# Patient Record
Sex: Female | Born: 1990 | Race: Black or African American | Hispanic: No | Marital: Married | Smoking: Current every day smoker
Health system: Southern US, Community
[De-identification: ages and names within clinical notes are randomized; demographics above are authoritative.]

## PROBLEM LIST (undated history)

## (undated) ENCOUNTER — Emergency Department: Admission: EM | Payer: Self-pay | Source: Home / Self Care

## (undated) DIAGNOSIS — J45909 Unspecified asthma, uncomplicated: Secondary | ICD-10-CM

---
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
## 2012-01-25 ENCOUNTER — Emergency Department: Payer: Self-pay | Admitting: Emergency Medicine

## 2012-01-25 LAB — COMPREHENSIVE METABOLIC PANEL
Alkaline Phosphatase: 125 U/L (ref 82–169)
Bilirubin,Total: 0.3 mg/dL (ref 0.2–1.0)
Chloride: 108 mmol/L — ABNORMAL HIGH (ref 98–107)
Co2: 23 mmol/L (ref 21–32)
Creatinine: 0.75 mg/dL (ref 0.60–1.30)
Glucose: 89 mg/dL (ref 65–99)
Osmolality: 277 (ref 275–301)
Potassium: 3.9 mmol/L (ref 3.5–5.1)
SGOT(AST): 15 U/L (ref 0–26)
SGPT (ALT): 21 U/L (ref 12–78)

## 2012-01-25 LAB — CBC
HGB: 13.3 g/dL (ref 12.0–16.0)
MCH: 25.9 pg — ABNORMAL LOW (ref 26.0–34.0)
MCHC: 32.7 g/dL (ref 32.0–36.0)
RBC: 5.14 10*6/uL (ref 3.80–5.20)
RDW: 15 % — ABNORMAL HIGH (ref 11.5–14.5)
WBC: 8.1 10*3/uL (ref 3.6–11.0)

## 2012-01-25 LAB — URINALYSIS, COMPLETE
Bilirubin,UR: NEGATIVE
Glucose,UR: NEGATIVE mg/dL (ref 0–75)
Leukocyte Esterase: NEGATIVE
Nitrite: NEGATIVE
Ph: 6 (ref 4.5–8.0)
Protein: NEGATIVE
RBC,UR: <1 /HPF (ref 0–5)
Squamous Epithelial: 11
WBC UR: 2 /HPF (ref 0–5)

## 2012-02-24 ENCOUNTER — Emergency Department: Payer: Self-pay | Admitting: Emergency Medicine

## 2012-02-24 LAB — URINALYSIS, COMPLETE
Ketone: NEGATIVE
Ph: 5 (ref 4.5–8.0)
RBC,UR: <1 /HPF (ref 0–5)
Specific Gravity: 1.021 (ref 1.003–1.030)
WBC UR: 1 /HPF (ref 0–5)

## 2012-09-29 ENCOUNTER — Emergency Department: Payer: Self-pay | Admitting: Emergency Medicine

## 2013-08-25 ENCOUNTER — Emergency Department: Payer: Self-pay | Admitting: Emergency Medicine

## 2013-09-19 ENCOUNTER — Emergency Department: Payer: Self-pay | Admitting: Student

## 2013-11-25 ENCOUNTER — Emergency Department: Payer: Self-pay | Admitting: Emergency Medicine

## 2014-01-30 ENCOUNTER — Emergency Department: Payer: Self-pay | Admitting: Emergency Medicine

## 2014-04-12 ENCOUNTER — Emergency Department
Admit: 2014-04-12 | Disposition: A | Discharge status: Home / Self Care | Payer: Self-pay | Admitting: Emergency Medicine

## 2014-04-12 LAB — COMPREHENSIVE METABOLIC PANEL
ALT: 15 U/L
ANION GAP: 7 (ref 7–16)
AST: 23 U/L
Albumin: 3.6 g/dL
Alkaline Phosphatase: 77 U/L
BILIRUBIN TOTAL: 0.6 mg/dL
BUN: 11 mg/dL
CALCIUM: 8.7 mg/dL — AB
CO2: 25 mmol/L
Chloride: 106 mmol/L
Creatinine: 0.85 mg/dL
Glucose: 100 mg/dL — ABNORMAL HIGH
Potassium: 3.3 mmol/L — ABNORMAL LOW
Sodium: 138 mmol/L
Total Protein: 7.4 g/dL

## 2014-04-12 LAB — CBC
HCT: 40 % (ref 35.0–47.0)
HGB: 12.9 g/dL (ref 12.0–16.0)
MCH: 26.2 pg (ref 26.0–34.0)
MCHC: 32.4 g/dL (ref 32.0–36.0)
MCV: 81 fL (ref 80–100)
PLATELETS: 250 10*3/uL (ref 150–440)
RBC: 4.94 10*6/uL (ref 3.80–5.20)
RDW: 14.3 % (ref 11.5–14.5)
WBC: 9.7 10*3/uL (ref 3.6–11.0)

## 2014-04-12 LAB — URINALYSIS, COMPLETE
Bacteria: NEGATIVE
Bilirubin,UR: NEGATIVE
GLUCOSE, UR: NEGATIVE mg/dL (ref 0–75)
Ketone: NEGATIVE
LEUKOCYTE ESTERASE: NEGATIVE
NITRITE: NEGATIVE
Ph: 6 (ref 4.5–8.0)
Protein: NEGATIVE
Specific Gravity: 1.026 (ref 1.003–1.030)

## 2014-04-12 LAB — LIPASE, BLOOD: LIPASE: 47 U/L

## 2014-06-07 ENCOUNTER — Encounter (HOSPITAL_COMMUNITY): Payer: Self-pay | Admitting: Emergency Medicine

## 2014-06-07 ENCOUNTER — Emergency Department (HOSPITAL_COMMUNITY): Payer: Self-pay

## 2014-06-07 ENCOUNTER — Other Ambulatory Visit: Payer: Self-pay

## 2014-06-07 ENCOUNTER — Emergency Department (HOSPITAL_COMMUNITY)
Admission: EM | Admit: 2014-06-07 | Discharge: 2014-06-07 | Disposition: A | Discharge status: Home / Self Care | Payer: Self-pay | Attending: Emergency Medicine | Admitting: Emergency Medicine

## 2014-06-07 DIAGNOSIS — R55 Syncope and collapse: Secondary | ICD-10-CM | POA: Insufficient documentation

## 2014-06-07 DIAGNOSIS — S0990XA Unspecified injury of head, initial encounter: Secondary | ICD-10-CM | POA: Insufficient documentation

## 2014-06-07 DIAGNOSIS — S060X1A Concussion with loss of consciousness of 30 minutes or less, initial encounter: Secondary | ICD-10-CM | POA: Insufficient documentation

## 2014-06-07 DIAGNOSIS — S0011XA Contusion of right eyelid and periocular area, initial encounter: Secondary | ICD-10-CM | POA: Insufficient documentation

## 2014-06-07 DIAGNOSIS — H9313 Tinnitus, bilateral: Secondary | ICD-10-CM | POA: Insufficient documentation

## 2014-06-07 DIAGNOSIS — R52 Pain, unspecified: Secondary | ICD-10-CM

## 2014-06-07 DIAGNOSIS — S0993XA Unspecified injury of face, initial encounter: Secondary | ICD-10-CM | POA: Insufficient documentation

## 2014-06-07 DIAGNOSIS — Y939 Activity, unspecified: Secondary | ICD-10-CM | POA: Insufficient documentation

## 2014-06-07 DIAGNOSIS — S0991XA Unspecified injury of ear, initial encounter: Secondary | ICD-10-CM | POA: Insufficient documentation

## 2014-06-07 DIAGNOSIS — Z720 Tobacco use: Secondary | ICD-10-CM | POA: Insufficient documentation

## 2014-06-07 DIAGNOSIS — S199XXA Unspecified injury of neck, initial encounter: Secondary | ICD-10-CM | POA: Insufficient documentation

## 2014-06-07 DIAGNOSIS — J45909 Unspecified asthma, uncomplicated: Secondary | ICD-10-CM | POA: Insufficient documentation

## 2014-06-07 DIAGNOSIS — Y999 Unspecified external cause status: Secondary | ICD-10-CM | POA: Insufficient documentation

## 2014-06-07 DIAGNOSIS — R42 Dizziness and giddiness: Secondary | ICD-10-CM | POA: Insufficient documentation

## 2014-06-07 DIAGNOSIS — S069X9A Unspecified intracranial injury with loss of consciousness of unspecified duration, initial encounter: Secondary | ICD-10-CM

## 2014-06-07 DIAGNOSIS — Y929 Unspecified place or not applicable: Secondary | ICD-10-CM | POA: Insufficient documentation

## 2014-06-07 DIAGNOSIS — W2111XA Struck by baseball bat, initial encounter: Secondary | ICD-10-CM | POA: Insufficient documentation

## 2014-06-07 DIAGNOSIS — H539 Unspecified visual disturbance: Secondary | ICD-10-CM | POA: Insufficient documentation

## 2014-06-07 HISTORY — DX: Unspecified asthma, uncomplicated: J45.909

## 2014-06-07 MED ORDER — HYDROCODONE-ACETAMINOPHEN 5-325 MG PO TABS
1.0000 | ORAL_TABLET | Freq: Once | ORAL | Status: AC
  Administered 2014-06-07: 1 via ORAL
  Filled 2014-06-07: qty 1

## 2014-06-07 MED ORDER — IBUPROFEN 800 MG PO TABS: 800.0000 mg | ORAL_TABLET | Freq: Three times a day (TID) | ORAL | Status: DC

## 2014-06-07 MED ORDER — HYDROCODONE-ACETAMINOPHEN 5-325 MG PO TABS: 1.0000 | ORAL_TABLET | Freq: Four times a day (QID) | ORAL | Status: DC | PRN
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
## 2014-06-07 NOTE — Discharge Instructions (Signed)
Concussion Direct trauma to the head often causes a condition known as a concussion. This injury can temporarily interfere with brain function and may cause you to pass out (lose consciousness). The consequences of a concussion are usually short-term, but repetitive concussions can be very dangerous. If you have multiple concussions, you will have a greater risk of long-term effects, such as slurred speech, slow movements, impaired thinking, or tremors. The severity of a concussion is based on the length and severity of the interference with brain activity. SYMPTOMS  Symptoms of a concussion vary depending on the severity of the injury. Very mild concussions may even occur without any noticeable symptoms. Swelling in the area of the injury is not related to the seriousness of the injury.   Mild concussion:  Temporary loss of consciousness may or may not occur.  Memory loss (amnesia) for a short time.  Emotional instability.  Confusion.  Severe concussion:  Usually prolonged loss of consciousness.  Confusion  One pupil (the black part in the middle of the eye) is larger than the other.  Changes in vision (including blurring).  Changes in breathing.  Disturbed balance (equilibrium).  Headaches.  Confusion.  Nausea or vomiting.  Slower reaction time than normal.  Difficulty learning and remembering things you have heard. CAUSES  A concussion is the result of trauma to the head. When the head is subjected to such an injury, the brain strikes against the inner wall of the skull. This impact is what causes the damage to the brain. The force of injury is related to severity of injury. The most severe concussions are associated with incidents that involve large impact forces such as motor vehicle accidents. Wearing a helmet will reduce the severity of trauma to the head, but concussions may still occur if you are wearing a helmet. RISK INCREASES WITH:  Contact sports (football,  hockey, soccer, rugby, basketball or lacrosse).  Fighting sports (martial arts or boxing).  Riding bicycles, motorcycles, or horses (when you ride without a helmet). PREVENTION  Wear proper protective headgear and ensure correct fit.  Wear seat belts when driving and riding in a car.  Do not drink or use mind-altering drugs and drive. PROGNOSIS  Concussions are typically curable if they are recognized and treated early. If a severe concussion or multiple concussions go untreated, then the complications may be life-threatening or cause permanent disability and brain damage. RELATED COMPLICATIONS   Permanent brain damage (slurred speech, slow movement, impaired thinking, or tremors).  Bleeding under the skull (subdural hemorrhage or hematoma, epidural hematoma).  Bleeding into the brain.  Prolonged healing time if usual activities are resumed too soon.  Infection if skin over the concussion site is broken.  Increased risk of future concussions (less trauma is required for a second concussion than the first). TREATMENT  Treatment initially requires immediate evaluation to determine the severity of the concussion. Occasionally, a hospital stay may be required for observation and treatment.  Avoid exertion. Bed rest for the first 24-48 hours is recommended.  Return to play is a controversial subject due to the increased risk for future injury as well as permanent disability and should be discussed at length with your treating caregiver. Many factors such as the severity of the concussion and whether this is the first, second, or third concussion play a role in timing a patient's return to sports.  MEDICATION  Do not give any medicine, including non-prescription acetaminophen or aspirin, until the diagnosis is certain. These medicines may mask developing  symptoms.  SEEK IMMEDIATE MEDICAL CARE IF:   Symptoms get worse or do not improve in 24 hours.  Any of the following symptoms  occur:  Vomiting.  The inability to move arms and legs equally well on both sides.  Fever.  Neck stiffness.  Pupils of unequal size, shape, or reactivity.  Convulsions.  Noticeable restlessness.  Severe headache that persists for longer than 4 hours after injury.  Confusion, disorientation, or mental status changes. Document Released: 12/21/2004 Document Revised: 10/11/2012 Document Reviewed: 04/04/2008 [facility] Patient Information 2015 [place], [place]. This information is not intended to replace advice given to you by your health care provider. Make sure you discuss any questions you have with your health care provider.  Head Injury You have a head injury. Headaches and throwing up (vomiting) are common after a head injury. It should be easy to wake up from sleeping. Sometimes you must stay in the hospital. Most problems happen within the first 24 hours. Side effects may occur up to 7-10 days after the injury.  WHAT ARE THE TYPES OF HEAD INJURIES? Head injuries can be as minor as a bump. Some head injuries can be more severe. More severe head injuries include:  A jarring injury to the brain (concussion).  A bruise of the brain (contusion). This mean there is bleeding in the brain that can cause swelling.  A cracked skull (skull fracture).  Bleeding in the brain that collects, clots, and forms a bump (hematoma). WHEN SHOULD I GET HELP RIGHT AWAY?   You are confused or sleepy.  You cannot be woken up.  You feel sick to your stomach (nauseous) or keep throwing up (vomiting).  Your dizziness or unsteadiness is getting worse.  You have very bad, lasting headaches that are not helped by medicine. Take medicines only as told by your doctor.  You cannot use your arms or legs like normal.  You cannot walk.  You notice changes in the black spots in the center of the colored part of your eye (pupil).  You have clear or bloody fluid coming from your nose or ears.  You have  trouble seeing. During the next 24 hours after the injury, you must stay with someone who can watch you. This person should get help right away (call 911 in the [place] if you start to shake and are not able to control it (have seizures), you pass out, or you are unable to wake up. HOW CAN I PREVENT A HEAD INJURY IN THE FUTURE?  Wear seat belts.  Wear a helmet while bike riding and playing sports like football.  Stay away from dangerous activities around the house. WHEN CAN I RETURN TO NORMAL ACTIVITIES AND ATHLETICS? See your doctor before doing these activities. You should not do normal activities or play contact sports until 1 week after the following symptoms have stopped:  Headache that does not go away.  Dizziness.  Poor attention.  Confusion.  Memory problems.  Sickness to your stomach or throwing up.  Tiredness.  Fussiness.  Bothered by bright lights or loud noises.  Anxiousness or depression.  Restless sleep. MAKE SURE YOU:   Understand these instructions.  Will watch your condition.  Will get help right away if you are not doing well or get worse. Document Released: 12/04/2007 Document Revised: 05/07/2013 Document Reviewed: 08/28/2012 [facility] Patient Information 2015 [place], [place]. This information is not intended to replace advice given to you by your health care provider. Make sure you discuss any questions you have  with your health care provider. ° °
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
## 2014-06-18 NOTE — ED Provider Notes (Signed)
Medical screening examination/treatment/procedure(s) were performed by non-physician practitioner and as supervising physician I was immediately available for consultation/collaboration.  Medical screening examination/treatment/procedure(s) were performed by non-physician practitioner and as supervising physician I was immediately available for consultation/collaboration.   EKG Interpretation   Date/Time:  Friday June 07 2014 14:50:00 EDT Ventricular Rate:  97 PR Interval:  130 QRS Duration: 74 QT Interval:  346 QTC Calculation: 439 R Axis:   106 Text Interpretation:  Suspect arm lead reversal, interpretation  assumes no reversal Sinus rhythm with marked sinus arrhythmia Possible  Right ventricular hypertrophy Nonspecific T wave abnormality Abnormal ECG  ED PHYSICIAN INTERPRETATION AVAILABLE IN [facility] Confirmed by  TEST, Record ([id number]) on 06/11/2014 7:53:00 AM        Doug Sou, MD 06/18/14 1547

## 2014-09-27 ENCOUNTER — Encounter: Payer: Self-pay | Admitting: Emergency Medicine

## 2014-09-27 ENCOUNTER — Emergency Department
Admission: EM | Admit: 2014-09-27 | Discharge: 2014-09-27 | Disposition: A | Discharge status: Home / Self Care | Payer: Self-pay | Attending: Emergency Medicine | Admitting: Emergency Medicine

## 2014-09-27 DIAGNOSIS — R064 Hyperventilation: Secondary | ICD-10-CM | POA: Insufficient documentation

## 2014-09-27 DIAGNOSIS — Z720 Tobacco use: Secondary | ICD-10-CM | POA: Insufficient documentation

## 2014-09-27 DIAGNOSIS — R1012 Left upper quadrant pain: Secondary | ICD-10-CM | POA: Insufficient documentation

## 2014-09-27 DIAGNOSIS — Z3202 Encounter for pregnancy test, result negative: Secondary | ICD-10-CM | POA: Insufficient documentation

## 2014-09-27 DIAGNOSIS — R079 Chest pain, unspecified: Secondary | ICD-10-CM | POA: Insufficient documentation

## 2014-09-27 DIAGNOSIS — F419 Anxiety disorder, unspecified: Secondary | ICD-10-CM | POA: Insufficient documentation

## 2014-09-27 DIAGNOSIS — Z791 Long term (current) use of non-steroidal anti-inflammatories (NSAID): Secondary | ICD-10-CM | POA: Insufficient documentation

## 2014-09-27 LAB — COMPREHENSIVE METABOLIC PANEL
ALBUMIN: 4 g/dL (ref 3.5–5.0)
ALK PHOS: 72 U/L (ref 38–126)
ALT: 16 U/L (ref 14–54)
AST: 23 U/L (ref 15–41)
Anion gap: 6 (ref 5–15)
BUN: 11 mg/dL (ref 6–20)
CHLORIDE: 108 mmol/L (ref 101–111)
CO2: 24 mmol/L (ref 22–32)
CREATININE: 0.88 mg/dL (ref 0.44–1.00)
Calcium: 8.9 mg/dL (ref 8.9–10.3)
GFR calc Af Amer: >60 mL/min (ref >60)
GFR calc non Af Amer: >60 mL/min (ref >60)
GLUCOSE: 92 mg/dL (ref 65–99)
Potassium: 3.6 mmol/L (ref 3.5–5.1)
SODIUM: 138 mmol/L (ref 135–145)
Total Bilirubin: 0.6 mg/dL (ref 0.3–1.2)
Total Protein: 7.3 g/dL (ref 6.5–8.1)

## 2014-09-27 LAB — URINALYSIS COMPLETE WITH MICROSCOPIC (ARMC ONLY)
Bilirubin Urine: NEGATIVE
Glucose, UA: NEGATIVE mg/dL
Hgb urine dipstick: NEGATIVE
Nitrite: NEGATIVE
PROTEIN: NEGATIVE mg/dL
Specific Gravity, Urine: 1.027 (ref 1.005–1.030)
pH: 7 (ref 5.0–8.0)

## 2014-09-27 LAB — CBC
HCT: 37.6 % (ref 35.0–47.0)
HEMOGLOBIN: 12 g/dL (ref 12.0–16.0)
MCH: 26 pg (ref 26.0–34.0)
MCHC: 31.9 g/dL — ABNORMAL LOW (ref 32.0–36.0)
MCV: 81.3 fL (ref 80.0–100.0)
Platelets: 269 10*3/uL (ref 150–440)
RBC: 4.62 MIL/uL (ref 3.80–5.20)
RDW: 13.9 % (ref 11.5–14.5)
WBC: 10.4 10*3/uL (ref 3.6–11.0)

## 2014-09-27 LAB — PREGNANCY, URINE: Preg Test, Ur: NEGATIVE

## 2014-09-27 LAB — LIPASE, BLOOD: Lipase: 21 U/L — ABNORMAL LOW (ref 22–51)

## 2014-09-27 MED ORDER — LORAZEPAM 2 MG/ML IJ SOLN
1.0000 mg | Freq: Once | INTRAMUSCULAR | Status: AC
  Administered 2014-09-27: 1 mg via INTRAVENOUS
  Filled 2014-09-27: qty 1

## 2014-09-27 NOTE — ED Notes (Signed)
Pt states she got a sharp pain in her left side of her abdomen and then started having a cramping in her left arm.  She also reports she has had nausea and vomiting. She has a history of asthma.  Patient reports having some anxiety and a panic attack.  Denies any pain, but states her arm just "feels weird"

## 2014-09-27 NOTE — Discharge Instructions (Signed)
Panic Attacks °Panic attacks are sudden, short feelings of great fear or discomfort. You may have them for no reason when you are relaxed, when you are uneasy (anxious), or when you are sleeping.  °HOME CARE °· Take all your medicines as told. °· Check with your doctor before starting new medicines. °· Keep all doctor visits. °GET HELP IF: °· You are not able to take your medicines as told. °· Your symptoms do not get better. °· Your symptoms get worse. °GET HELP RIGHT AWAY IF: °· Your attacks seem different than your normal attacks. °· You have thoughts about hurting yourself or others. °· You take panic attack medicine and you have a side effect. °MAKE SURE YOU: °· Understand these instructions. °· Will watch your condition. °· Will get help right away if you are not doing well or get worse. °Document Released: 01/23/2010 Document Revised: 10/11/2012 Document Reviewed: 08/04/2012 °ExitCare® Patient Information ©2015 Exit[employer], LLC. This information is not intended to replace advice given to you by your health care provider. Make sure you discuss any questions you have with your health care provider. ° °

## 2014-09-27 NOTE — ED Notes (Signed)
Reports waking up this am and having numbness in left hand.  Pt arrived with skin w/d, resp 32.  Obvious cramp in left hand.  Ambulates well, no facial droop.

## 2014-09-27 NOTE — ED Provider Notes (Signed)
[facility] Emergency Department Provider Note  Time seen: 8:08 AM  I have reviewed the triage vital signs and the nursing notes.   HISTORY  Chief Complaint Hand Pain    HPI EIZA CANNIFF is a 24 y.o. female with a past medical history of asthma who presents to the emergency department with cramping in her left hand. Patient states she awoke with a sharp pain in her left abdomen/chest. She states she started "freaking out" and was having a hard time catching her breath, and began experiencing left arm tingling/numbness, and her hand cramped and she was unable to relax the cramp in her hand so she came to the emergency department. States the pain caused her to vomit once or twice this morning. However the pain is completely resolved at this time. Denies any nausea at this time. Denies any trouble breathing, chest pain, but continues to have cramping in the left hand.     Past Medical History  Diagnosis Date  . Asthma     There are no active problems to display for this patient.   History reviewed. No pertinent past surgical history.  Current Outpatient Rx  Name  Route  Sig  Dispense  Refill  . HYDROcodone-acetaminophen (NORCO/VICODIN) 5-325 MG per tablet   Oral   Take 1 tablet by mouth every 6 (six) hours as needed for severe pain.   6 tablet   0   . ibuprofen (ADVIL,MOTRIN) 800 MG tablet   Oral   Take 1 tablet (800 mg total) by mouth 3 (three) times daily.   21 tablet   0     Allergies Review of patient's allergies indicates no known allergies.  History reviewed. No pertinent family history.  Social History Social History  Substance Use Topics  . Smoking status: Current Every Day Smoker    Types: Cigarettes  . Smokeless tobacco: None  . Alcohol Use: Yes    Review of Systems Constitutional: Negative for fever. Cardiovascular: Left chest/upper abdominal pain, now resolved. Respiratory: Difficulty catching her breath per patient,  now resolved. Gastrointestinal: Left upper quadrant abdominal pain, now resolved. Genitourinary: Negative for dysuria. Musculoskeletal: Cramping of her left hand which continues. Neurological: Negative for headache 10-point ROS otherwise negative.  ____________________________________________   PHYSICAL EXAM:  VITAL SIGNS: ED Triage Vitals  Enc Vitals Group     BP 09/27/14 0755 136/92 mmHg     Pulse Rate 09/27/14 0755 95     Resp 09/27/14 0755 36     Temp 09/27/14 0755 98.5 F (36.9 C)     Temp Source 09/27/14 0755 Oral     SpO2 09/27/14 0755 98 %     Weight 09/27/14 0755 240 lb (108.863 kg)     Height 09/27/14 0755  (1.651 m)     Head Cir --      Peak Flow --      Pain Score 09/27/14 0755 5     Pain Loc --      Pain Edu? --      Excl. in GC? --     Constitutional: Alert and oriented. Very anxious-appearing, hyperventilating, tremulous speech Eyes: Normal exam ENT   Mouth/Throat: Mucous membranes are moist. Cardiovascular: Normal rate, regular rhythm. No murmurs, rubs, or gallops. Respiratory: Normal respiratory effort without tachypnea nor retractions. Breath sounds are clear and equal bilaterally. No wheezes/rales/rhonchi. Nontender chest palpation. Gastrointestinal: Soft and nontender. No distention.   Musculoskeletal: Normal range of motion in all extremities. Patient does have mild  cramping of the left hand which she states is much improved at this time. Neurovascularly intact. Neurologic:  Normal speech and language. No gross focal neurologic deficits  Psychiatric: Mood and affect are normal. Speech and behavior are normal. Patient exhibits appropriate ____________________________________________    EKG  EKG reviewed and interpreted by myself shows sinus bradycardia at 51 bpm, narrow QRS, normal axis, normal intervals, no ST changes noted. Largely Normal EKG.  ____________________________________________     INITIAL IMPRESSION / ASSESSMENT AND PLAN /  ED COURSE  Pertinent labs & imaging results that were available during my care of the patient were reviewed by me and considered in my medical decision making (see chart for details).  Patient presents with hyperventilation, nausea, cramping in her hand. Symptoms are very suggestive of anxiety reaction leading to muscle cramping. Patient appears very anxious currently but denies any pain. We will check labs, and EKG, and treat with IV Ativan. We'll closely monitor in the emergency department. Overall the patient appears quite well. And states symptoms appear to be improving.  Labs are within normal limits. Patient is feeling much better, much calmer, denies any arm pain, weakness/numbness, no hand cramping. Patient feels much better. Suspect likely anxiety reaction.  ____________________________________________   FINAL CLINICAL IMPRESSION(S) / ED DIAGNOSES  Anxiety reaction  Minna Antis, MD 09/27/14 1140

## 2014-09-27 NOTE — ED Notes (Signed)
Patient reports left hand cramping is slightly improving.  Denies any other pain or nausea.

## 2015-01-28 ENCOUNTER — Encounter: Payer: Self-pay | Admitting: Emergency Medicine

## 2015-01-28 ENCOUNTER — Emergency Department
Admission: EM | Admit: 2015-01-28 | Discharge: 2015-01-28 | Disposition: A | Discharge status: Home / Self Care | Payer: Self-pay | Attending: Student | Admitting: Student

## 2015-01-28 DIAGNOSIS — Z791 Long term (current) use of non-steroidal anti-inflammatories (NSAID): Secondary | ICD-10-CM | POA: Insufficient documentation

## 2015-01-28 DIAGNOSIS — J111 Influenza due to unidentified influenza virus with other respiratory manifestations: Secondary | ICD-10-CM | POA: Insufficient documentation

## 2015-01-28 DIAGNOSIS — F17210 Nicotine dependence, cigarettes, uncomplicated: Secondary | ICD-10-CM | POA: Insufficient documentation

## 2015-01-28 MED ORDER — OSELTAMIVIR PHOSPHATE 75 MG PO CAPS: 75.0000 mg | ORAL_CAPSULE | Freq: Two times a day (BID) | ORAL | Status: DC

## 2015-01-28 MED ORDER — ONDANSETRON 4 MG PO TBDP: 4.0000 mg | ORAL_TABLET | Freq: Three times a day (TID) | ORAL | Status: DC | PRN

## 2015-01-28 NOTE — ED Notes (Signed)
Patient c/o feeling cold, bad cough.  Onset of symptoms last night.

## 2015-01-28 NOTE — ED Notes (Signed)
States she developed some body aches with chills yesterday  Cough and states she is unable to keep fluids down.  States she vomited prior to arrival

## 2015-01-28 NOTE — Discharge Instructions (Signed)

## 2015-01-28 NOTE — ED Provider Notes (Signed)
[facility] Emergency Department Provider Note  ____________________________________________  Time seen: Approximately 4:26 PM  I have reviewed the triage vital signs and the nursing notes.   HISTORY  Chief Complaint No chief complaint on file.    HPI ARIAM MOL is a 25 y.o. female who presents emergency department for sudden onset of fevers and chills, body aches, cough. She states that she also has some accompanying nausea. Patient states that all symptoms hit very suddenly and in severe. She has been taking Tylenol at home with no relief. Patient states that she has not had any doses but has endorse some nausea. Patient reports a decreased appetite.She does report sick contacts with flu.   Past Medical History  Diagnosis Date  . Asthma     There are no active problems to display for this patient.   History reviewed. No pertinent past surgical history.  Current Outpatient Rx  Name  Route  Sig  Dispense  Refill  . HYDROcodone-acetaminophen (NORCO/VICODIN) 5-325 MG per tablet   Oral   Take 1 tablet by mouth every 6 (six) hours as needed for severe pain.   6 tablet   0   . ibuprofen (ADVIL,MOTRIN) 800 MG tablet   Oral   Take 1 tablet (800 mg total) by mouth 3 (three) times daily.   21 tablet   0   . ondansetron (ZOFRAN-ODT) 4 MG disintegrating tablet   Oral   Take 1 tablet (4 mg total) by mouth every 8 (eight) hours as needed for nausea or vomiting.   20 tablet   0   . oseltamivir (TAMIFLU) 75 MG capsule   Oral   Take 1 capsule (75 mg total) by mouth 2 (two) times daily.   14 capsule   0     Allergies Review of patient's allergies indicates no known allergies.  No family history on file.  Social History Social History  Substance Use Topics  . Smoking status: Current Every Day Smoker    Types: Cigarettes  . Smokeless tobacco: None  . Alcohol Use: Yes     Review of Systems  Constitutional: Positive for  fever/chills Eyes: No visual changes. No discharge ENT: No sore throat. Cardiovascular: no chest pain. Respiratory: Positive for cough. No SOB. Gastrointestinal: No abdominal pain.  No nausea, no vomiting.  No diarrhea.  No constipation. Genitourinary: Negative for dysuria. No hematuria Musculoskeletal: Negative for back pain. Positive for myalgias. Skin: Negative for rash. Neurological: Negative for headaches, focal weakness or numbness. 10-point ROS otherwise negative.  ____________________________________________   PHYSICAL EXAM:  VITAL SIGNS: ED Triage Vitals  Enc Vitals Group     BP 01/28/15 1604 132/75 mmHg     Pulse Rate 01/28/15 1604 110     Resp 01/28/15 1604 18     Temp 01/28/15 1604 99.2 F (37.3 C)     Temp Source 01/28/15 1604 Oral     SpO2 01/28/15 1604 100 %     Weight 01/28/15 1604 235 lb (106.595 kg)     Height 01/28/15 1604  (1.651 m)     Head Cir --      Peak Flow --      Pain Score 01/28/15 1611 8     Pain Loc --      Pain Edu? --      Excl. in GC? --      Constitutional: Alert and oriented. Well appearing and in no acute distress. Eyes: Conjunctivae are normal. PERRL. EOMI. Head: Atraumatic.  ENT:      Ears: EACs and TMs are unremarkable bilaterally.      Nose: No congestion/rhinnorhea.      Mouth/Throat: Mucous membranes are moist. Oropharynx is nonerythematous and nonedematous. Tonsils are unremarkable bilaterally. Neck: No stridor.   Hematological/Lymphatic/Immunilogical: No cervical lymphadenopathy. Cardiovascular: Normal rate, regular rhythm. Normal S1 and S2.  Good peripheral circulation. Respiratory: Normal respiratory effort without tachypnea or retractions. Lungs CTAB. Gastrointestinal: Soft and nontender. No distention. No CVA tenderness. Musculoskeletal: No lower extremity tenderness nor edema.  No joint effusions. Neurologic:  Normal speech and language. No gross focal neurologic deficits are appreciated.  Skin:  Skin is warm,  dry and intact. No rash noted. Psychiatric: Mood and affect are normal. Speech and behavior are normal. Patient exhibits appropriate insight and judgement.   ____________________________________________   LABS (all labs ordered are listed, but only abnormal results are displayed)  Labs Reviewed - No data to display ____________________________________________  EKG   ____________________________________________  RADIOLOGY   No results found.  ____________________________________________    PROCEDURES  Procedure(s) performed:       Medications - No data to display   ____________________________________________   INITIAL IMPRESSION / ASSESSMENT AND PLAN / ED COURSE  Pertinent labs & imaging results that were available during my care of the patient were reviewed by me and considered in my medical decision making (see chart for details).  Patient's diagnosis is consistent with the flu. Patient has a sudden onset classic flulike symptoms. Flu testing is offered to the patient but she declines at this time. She will be treated for the flu. Patient will be discharged home with prescriptions for antiasthmatic/nausea medicine as well as Tamiflu. Patient is to follow up with primary care provider if symptoms persist past this treatment course. Patient is given ED precautions to return to the ED for any worsening or new symptoms.     ____________________________________________  FINAL CLINICAL IMPRESSION(S) / ED DIAGNOSES  Final diagnoses:  Influenza      NEW MEDICATIONS STARTED DURING THIS VISIT:  New Prescriptions   ONDANSETRON (ZOFRAN-ODT) 4 MG DISINTEGRATING TABLET    Take 1 tablet (4 mg total) by mouth every 8 (eight) hours as needed for nausea or vomiting.   OSELTAMIVIR (TAMIFLU) 75 MG CAPSULE    Take 1 capsule (75 mg total) by mouth 2 (two) times daily.        Delorise Royals Adrijana Haros, PA-C 01/28/15 1642  Gayla Doss, MD 01/28/15 [phone]

## 2015-02-22 ENCOUNTER — Emergency Department
Admission: EM | Admit: 2015-02-22 | Discharge: 2015-02-22 | Disposition: A | Discharge status: Home / Self Care | Payer: Self-pay | Attending: Emergency Medicine | Admitting: Emergency Medicine

## 2015-02-22 ENCOUNTER — Encounter: Payer: Self-pay | Admitting: Emergency Medicine

## 2015-02-22 DIAGNOSIS — Z791 Long term (current) use of non-steroidal anti-inflammatories (NSAID): Secondary | ICD-10-CM | POA: Insufficient documentation

## 2015-02-22 DIAGNOSIS — Y9389 Activity, other specified: Secondary | ICD-10-CM | POA: Insufficient documentation

## 2015-02-22 DIAGNOSIS — Z79899 Other long term (current) drug therapy: Secondary | ICD-10-CM | POA: Insufficient documentation

## 2015-02-22 DIAGNOSIS — F17210 Nicotine dependence, cigarettes, uncomplicated: Secondary | ICD-10-CM | POA: Insufficient documentation

## 2015-02-22 DIAGNOSIS — S60051A Contusion of right little finger without damage to nail, initial encounter: Secondary | ICD-10-CM | POA: Insufficient documentation

## 2015-02-22 DIAGNOSIS — W228XXA Striking against or struck by other objects, initial encounter: Secondary | ICD-10-CM | POA: Insufficient documentation

## 2015-02-22 DIAGNOSIS — Y998 Other external cause status: Secondary | ICD-10-CM | POA: Insufficient documentation

## 2015-02-22 DIAGNOSIS — Y9289 Other specified places as the place of occurrence of the external cause: Secondary | ICD-10-CM | POA: Insufficient documentation

## 2015-02-22 DIAGNOSIS — J45901 Unspecified asthma with (acute) exacerbation: Secondary | ICD-10-CM | POA: Insufficient documentation

## 2015-02-22 MED ORDER — NAPROXEN 500 MG PO TABS: 500.0000 mg | ORAL_TABLET | Freq: Two times a day (BID) | ORAL | Status: DC

## 2015-02-22 NOTE — ED Notes (Signed)
Pt reports having trouble with her asthma.  No resp distress, no labored breathing.  Lungs clear.  HS normal. Pt also reports repeatedly hitting her right pinky on things and it is tender.  No swelling noted.

## 2015-02-22 NOTE — ED Notes (Signed)
Pt states she has asthma and "wants her "breathing checked".  Also states her right pinky finger hurts.

## 2015-02-22 NOTE — Discharge Instructions (Signed)

## 2015-02-22 NOTE — ED Provider Notes (Signed)
[facility] Emergency Department Provider Note  ____________________________________________  Time seen: Approximately 7:27 AM  I have reviewed the triage vital signs and the nursing notes.   HISTORY  Chief Complaint Shortness of Breath and Finger Injury    HPI Tina Ibarra is a 25 y.o. female who presents emergency department complaining of "wheezing and might be related to my asthma." I just want my "breathing checked". Patient is also endorsing right pinky pain. Patient states that she had a history of asthma as a child but has not had a asthma attack since 25 years old. Patient states that she woke up this morning and heard some "wheezing" who presents emergency department to be "checked to see if my asthma edema." Patient denies any shortness of breath at this time. She denies any chest pain.  Patient also endorses right pinky pain. She states that she "might of hit a couple of days ago." Since then patient has had pain with movement. Patient denies any numbness or tingling of the distal aspect of the finger. She does endorse being able to move finger but reports pain with movement.   Past Medical History  Diagnosis Date  . Asthma     There are no active problems to display for this patient.   History reviewed. No pertinent past surgical history.  Current Outpatient Rx  Name  Route  Sig  Dispense  Refill  . HYDROcodone-acetaminophen (NORCO/VICODIN) 5-325 MG per tablet   Oral   Take 1 tablet by mouth every 6 (six) hours as needed for severe pain.   6 tablet   0   . ibuprofen (ADVIL,MOTRIN) 800 MG tablet   Oral   Take 1 tablet (800 mg total) by mouth 3 (three) times daily.   21 tablet   0   . naproxen (NAPROSYN) 500 MG tablet   Oral   Take 1 tablet (500 mg total) by mouth 2 (two) times daily with a meal.   60 tablet   0   . ondansetron (ZOFRAN-ODT) 4 MG disintegrating tablet   Oral   Take 1 tablet (4 mg total) by mouth every 8 (eight)  hours as needed for nausea or vomiting.   20 tablet   0   . oseltamivir (TAMIFLU) 75 MG capsule   Oral   Take 1 capsule (75 mg total) by mouth 2 (two) times daily.   14 capsule   0     Allergies Review of patient's allergies indicates no known allergies.  History reviewed. No pertinent family history.  Social History Social History  Substance Use Topics  . Smoking status: Current Every Day Smoker    Types: Cigarettes  . Smokeless tobacco: None  . Alcohol Use: Yes     Review of Systems  Constitutional: No fever/chills ENT: No sore throat. No nasal congestion. Cardiovascular: no chest pain. Respiratory: no cough. No SOB. Endorses an episode of wheezing this morning." Musculoskeletal: Negative for back pain. Positive for fifth digit pain right hand. Skin: Negative for rash. Neurological: Negative for headaches, focal weakness or numbness. 10-point ROS otherwise negative.  ____________________________________________   PHYSICAL EXAM:  VITAL SIGNS: ED Triage Vitals  Enc Vitals Group     BP 02/22/15 0720 122/65 mmHg     Pulse Rate 02/22/15 0720 99     Resp 02/22/15 0720 18     Temp 02/22/15 0720 98 F (36.7 C)     Temp Source 02/22/15 0720 Oral     SpO2 02/22/15 0720 99 %  Weight 02/22/15 0720 224 lb (101.606 kg)     Height 02/22/15 0720  (1.651 m)     Head Cir --      Peak Flow --      Pain Score 02/22/15 0718 8     Pain Loc --      Pain Edu? --      Excl. in GC? --      Constitutional: Alert and oriented. Well appearing and in no acute distress. Eyes: Conjunctivae are normal. PERRL. EOMI. Head: Atraumatic. ENT:      Ears: EACs and TMs are unremarkable bilaterally.      Nose: No congestion/rhinnorhea.      Mouth/Throat: Mucous membranes are moist. Oropharynx is nonerythematous and nonedematous. Neck: No stridor.   Hematological/Lymphatic/Immunilogical: No cervical lymphadenopathy. Cardiovascular: Normal rate, regular rhythm. Normal S1 and S2.   Good peripheral circulation. Respiratory: Normal respiratory effort without tachypnea or retractions. Lungs CTAB. Good air entry into the bases. No rales or rhonchi. No decreased or absent breath sounds. Musculoskeletal: No visible deformity or edema to the fifth digit right hand when compared to left. No ecchymosis or hematoma noted. Patient does have full range of motion but endorses pain with such. No palpable abnormality. Patient is diffusely tender to palpation over the proximal phalanx and the fifth metacarpal bone. No point tenderness. Neurologic:  Normal speech and language. No gross focal neurologic deficits are appreciated.  Skin:  Skin is warm, dry and intact. No rash noted. Psychiatric: Mood and affect are normal. Speech and behavior are normal. Patient exhibits appropriate insight and judgement.   ____________________________________________   LABS (all labs ordered are listed, but only abnormal results are displayed)  Labs Reviewed - No data to display ____________________________________________  EKG   ____________________________________________  RADIOLOGY   No results found.  ____________________________________________    PROCEDURES  Procedure(s) performed:       Medications - No data to display   ____________________________________________   INITIAL IMPRESSION / ASSESSMENT AND PLAN / ED COURSE  Pertinent labs & imaging results that were available during my care of the patient were reviewed by me and considered in my medical decision making (see chart for details).  Patient's diagnosis is consistent with fifth digit contusion right hand. Patient has no wheezing to auscultation. Patient is in no respiratory distress. No indication of asthma or other upper respiratory infection at this time.. Patient will be discharged home with prescriptions for anti-inflammatories for finger pain. Patient is to follow up with primary care provider if symptoms  persist past this treatment course. Patient is given ED precautions to return to the ED for any worsening or new symptoms.     ____________________________________________  FINAL CLINICAL IMPRESSION(S) / ED DIAGNOSES  Final diagnoses:  Contusion of fifth finger of right hand, initial encounter      NEW MEDICATIONS STARTED DURING THIS VISIT:  New Prescriptions   NAPROXEN (NAPROSYN) 500 MG TABLET    Take 1 tablet (500 mg total) by mouth 2 (two) times daily with a meal.        Racheal Patches, PA-C 02/22/15 1610  Arnaldo Natal, MD 02/22/15 1510

## 2015-03-19 ENCOUNTER — Encounter: Payer: Self-pay | Admitting: Emergency Medicine

## 2015-03-19 ENCOUNTER — Emergency Department
Admission: EM | Admit: 2015-03-19 | Discharge: 2015-03-19 | Disposition: A | Discharge status: Home / Self Care | Payer: Self-pay | Attending: Emergency Medicine | Admitting: Emergency Medicine

## 2015-03-19 DIAGNOSIS — J45909 Unspecified asthma, uncomplicated: Secondary | ICD-10-CM | POA: Insufficient documentation

## 2015-03-19 DIAGNOSIS — F17210 Nicotine dependence, cigarettes, uncomplicated: Secondary | ICD-10-CM | POA: Insufficient documentation

## 2015-03-19 DIAGNOSIS — B86 Scabies: Secondary | ICD-10-CM | POA: Insufficient documentation

## 2015-03-19 MED ORDER — PERMETHRIN 5 % EX CREA
TOPICAL_CREAM | Freq: Once | CUTANEOUS | Status: AC
Start: 2015-03-19 — End: 2015-03-19
  Administered 2015-03-19: 07:00:00 via TOPICAL
  Filled 2015-03-19: qty 60

## 2015-03-19 NOTE — ED Provider Notes (Signed)
[facility] Emergency Department Provider Note  ____________________________________________  Time seen: 6:10 AM  I have reviewed the triage vital signs and the nursing notes.   HISTORY  Chief Complaint Rash     HPI Tina Ibarra is a 25 y.o. female presents with generalize pruritic rash bilateral hands trunk and buttocks 4 days. Patient denies any fever. Patient states that "she stayed at a friend's house that was dirty" and that she had bedbugs as well.     Past Medical History  Diagnosis Date  . Asthma     There are no active problems to display for this patient.   History reviewed. No pertinent past surgical history.  Current Outpatient Rx  Name  Route  Sig  Dispense  Refill  . HYDROcodone-acetaminophen (NORCO/VICODIN) 5-325 MG per tablet   Oral   Take 1 tablet by mouth every 6 (six) hours as needed for severe pain.   6 tablet   0   . ibuprofen (ADVIL,MOTRIN) 800 MG tablet   Oral   Take 1 tablet (800 mg total) by mouth 3 (three) times daily.   21 tablet   0   . naproxen (NAPROSYN) 500 MG tablet   Oral   Take 1 tablet (500 mg total) by mouth 2 (two) times daily with a meal.   60 tablet   0   . ondansetron (ZOFRAN-ODT) 4 MG disintegrating tablet   Oral   Take 1 tablet (4 mg total) by mouth every 8 (eight) hours as needed for nausea or vomiting.   20 tablet   0   . oseltamivir (TAMIFLU) 75 MG capsule   Oral   Take 1 capsule (75 mg total) by mouth 2 (two) times daily.   14 capsule   0     Allergies Mushroom extract complex  History reviewed. No pertinent family history.  Social History Social History  Substance Use Topics  . Smoking status: Current Every Day Smoker    Types: Cigarettes  . Smokeless tobacco: None  . Alcohol Use: Yes    Review of Systems  Constitutional: Negative for fever. Eyes: Negative for visual changes. ENT: Negative for sore throat. Cardiovascular: Negative for chest  pain. Respiratory: Negative for shortness of breath. Gastrointestinal: Negative for abdominal pain, vomiting and diarrhea. Genitourinary: Negative for dysuria. Musculoskeletal: Negative for back pain. Skin: Positive for rash. Neurological: Negative for headaches, focal weakness or numbness.   10-point ROS otherwise negative.  ____________________________________________   PHYSICAL EXAM:  VITAL SIGNS: ED Triage Vitals  Enc Vitals Group     BP 03/19/15 0403 121/66 mmHg     Pulse Rate 03/19/15 0403 98     Resp 03/19/15 0403 18     Temp 03/19/15 0403 98 F (36.7 C)     Temp Source 03/19/15 0403 Oral     SpO2 03/19/15 0403 99 %     Weight 03/19/15 0403 235 lb (106.595 kg)     Height 03/19/15 0403 5\' 5"  (1.651 m)     Head Cir --      Peak Flow --      Pain Score 03/19/15 0404 0     Pain Loc --      Pain Edu? --      Excl. in GC? --      Constitutional: Alert and oriented. Well appearing and in no distress. Eyes: Conjunctivae are normal. PERRL. Normal extraocular movements. ENT   Head: Normocephalic and atraumatic.   Nose: No congestion/rhinnorhea.   Mouth/Throat: Mucous  membranes are moist.   Neck: No stridor Skin:  Maculopapular rash with treatment options consistent with scabies involving the web spacing's of the fingers trunk and buttocks no rash noted on the face. Psychiatric: Mood and affect are normal. Speech and behavior are normal. Patient exhibits appropriate insight and judgment.     INITIAL IMPRESSION / ASSESSMENT AND PLAN / ED COURSE  Pertinent labs & imaging results that were available during my care of the patient were reviewed by me and considered in my medical decision making (see chart for details).  History physical exam consistent with scabies such patient given permethrin  ____________________________________________   FINAL CLINICAL IMPRESSION(S) / ED DIAGNOSES  Final diagnoses:  Scabies      Darci Current, MD 03/19/15  2317

## 2015-03-19 NOTE — Discharge Instructions (Signed)

## 2015-03-19 NOTE — ED Notes (Signed)
Pt arrived to the ED for complaints of having a rash for the last 3 days. Pt states that is itchy and that comes and goes. Pt is AOx4 in no apparent distress.

## 2015-03-19 NOTE — ED Notes (Signed)
Pt c/o rash that started 4 days ago, pt has small "goosebump like" rash on hands bilat, and states its on her buttocks also. Denies fever. Pt has taken benadryl at home with no relief.

## 2015-04-24 ENCOUNTER — Emergency Department: Payer: Self-pay

## 2015-04-24 ENCOUNTER — Encounter: Payer: Self-pay | Admitting: Emergency Medicine

## 2015-04-24 ENCOUNTER — Emergency Department
Admission: EM | Admit: 2015-04-24 | Discharge: 2015-04-24 | Disposition: A | Discharge status: Home / Self Care | Payer: Self-pay | Attending: Emergency Medicine | Admitting: Emergency Medicine

## 2015-04-24 DIAGNOSIS — Y9389 Activity, other specified: Secondary | ICD-10-CM | POA: Insufficient documentation

## 2015-04-24 DIAGNOSIS — IMO0002 Reserved for concepts with insufficient information to code with codable children: Secondary | ICD-10-CM

## 2015-04-24 DIAGNOSIS — W208XXA Other cause of strike by thrown, projected or falling object, initial encounter: Secondary | ICD-10-CM | POA: Insufficient documentation

## 2015-04-24 DIAGNOSIS — Y92810 Car as the place of occurrence of the external cause: Secondary | ICD-10-CM | POA: Insufficient documentation

## 2015-04-24 DIAGNOSIS — Z79899 Other long term (current) drug therapy: Secondary | ICD-10-CM | POA: Insufficient documentation

## 2015-04-24 DIAGNOSIS — F17210 Nicotine dependence, cigarettes, uncomplicated: Secondary | ICD-10-CM | POA: Insufficient documentation

## 2015-04-24 DIAGNOSIS — J45909 Unspecified asthma, uncomplicated: Secondary | ICD-10-CM | POA: Insufficient documentation

## 2015-04-24 DIAGNOSIS — Z23 Encounter for immunization: Secondary | ICD-10-CM | POA: Insufficient documentation

## 2015-04-24 DIAGNOSIS — Z9109 Other allergy status, other than to drugs and biological substances: Secondary | ICD-10-CM | POA: Insufficient documentation

## 2015-04-24 DIAGNOSIS — S81811A Laceration without foreign body, right lower leg, initial encounter: Secondary | ICD-10-CM | POA: Insufficient documentation

## 2015-04-24 DIAGNOSIS — Y999 Unspecified external cause status: Secondary | ICD-10-CM | POA: Insufficient documentation

## 2015-04-24 MED ORDER — BACITRACIN ZINC 500 UNIT/GM EX OINT
TOPICAL_OINTMENT | CUTANEOUS | Status: AC
  Administered 2015-04-24: 1 via TOPICAL
  Filled 2015-04-24: qty 0.9

## 2015-04-24 MED ORDER — MORPHINE SULFATE (PF) 4 MG/ML IV SOLN
4.0000 mg | Freq: Once | INTRAVENOUS | Status: AC
  Administered 2015-04-24: 4 mg via INTRAMUSCULAR
  Filled 2015-04-24: qty 1

## 2015-04-24 MED ORDER — TRAMADOL HCL 50 MG PO TABS: 50.0000 mg | ORAL_TABLET | Freq: Four times a day (QID) | ORAL | Status: DC | PRN

## 2015-04-24 MED ORDER — BACITRACIN ZINC 500 UNIT/GM EX OINT
TOPICAL_OINTMENT | Freq: Once | CUTANEOUS | Status: AC
Start: 2015-04-24 — End: 2015-04-24
  Administered 2015-04-24: 1 via TOPICAL

## 2015-04-24 MED ORDER — TETANUS-DIPHTH-ACELL PERTUSSIS 5-2.5-18.5 LF-MCG/0.5 IM SUSP
0.5000 mL | Freq: Once | INTRAMUSCULAR | Status: AC
  Administered 2015-04-24: 0.5 mL via INTRAMUSCULAR
  Filled 2015-04-24: qty 0.5

## 2015-04-24 NOTE — ED Provider Notes (Signed)
[facility] Emergency Department Provider Note    ____________________________________________  Time seen: ~1055  I have reviewed the triage vital signs and the nursing notes.   HISTORY  Chief Complaint Extremity Laceration   History limited by: Not Limited   HPI Tina Ibarra is a 25 y.o. female who presents to the emergency department today because of right leg pain and laceration. The patient states she was working on her car. She states that a brake rotor fell onto her right leg. This happened about 1 hour prior to presentation. She did suffer a laceration at that time. She is complaining of severe pain at that site. This started after the accident. She denies any numbness or tingling to her foot. Denies any other injuries. He is unsure when her last tetanus shot was.    Past Medical History  Diagnosis Date  . Asthma     There are no active problems to display for this patient.   History reviewed. No pertinent past surgical history.  Current Outpatient Rx  Name  Route  Sig  Dispense  Refill  . HYDROcodone-acetaminophen (NORCO/VICODIN) 5-325 MG per tablet   Oral   Take 1 tablet by mouth every 6 (six) hours as needed for severe pain.   6 tablet   0   . ibuprofen (ADVIL,MOTRIN) 800 MG tablet   Oral   Take 1 tablet (800 mg total) by mouth 3 (three) times daily.   21 tablet   0   . naproxen (NAPROSYN) 500 MG tablet   Oral   Take 1 tablet (500 mg total) by mouth 2 (two) times daily with a meal.   60 tablet   0   . ondansetron (ZOFRAN-ODT) 4 MG disintegrating tablet   Oral   Take 1 tablet (4 mg total) by mouth every 8 (eight) hours as needed for nausea or vomiting.   20 tablet   0   . oseltamivir (TAMIFLU) 75 MG capsule   Oral   Take 1 capsule (75 mg total) by mouth 2 (two) times daily.   14 capsule   0     Allergies Mushroom extract complex  No family history on file.  Social History Social History  Substance Use  Topics  . Smoking status: Current Every Day Smoker    Types: Cigarettes  . Smokeless tobacco: None  . Alcohol Use: Yes    Review of Systems  Constitutional: Negative for fever. Cardiovascular: Negative for chest pain. Respiratory: Negative for shortness of breath. Gastrointestinal: Negative for abdominal pain, vomiting and diarrhea. Neurological: Negative for headaches, focal weakness or numbness.   10-point ROS otherwise negative.  ____________________________________________   PHYSICAL EXAM:  VITAL SIGNS: ED Triage Vitals  Enc Vitals Group     BP 04/24/15 1048 123/98 mmHg     Pulse Rate 04/24/15 1048 87     Resp 04/24/15 1048 18     Temp 04/24/15 1048 98 F (36.7 C)     Temp Source 04/24/15 1048 Oral     SpO2 04/24/15 1048 100 %     Weight 04/24/15 1048 231 lb (104.781 kg)     Height 04/24/15 1048  (1.651 m)     Head Cir --      Peak Flow --      Pain Score 04/24/15 1049 10   Constitutional: Alert and oriented. Appears quite anxious. Hyperventilating. Eyes: Conjunctivae are normal. PERRL. Normal extraocular movements. ENT   Head: Normocephalic and atraumatic.   Nose: No congestion/rhinnorhea.  Mouth/Throat: Mucous membranes are moist.   Neck: No stridor. Hematological/Lymphatic/Immunilogical: No cervical lymphadenopathy. Cardiovascular: Normal rate, regular rhythm.  No murmurs, rubs, or gallops. Respiratory: Tachypnea. Breath sounds are clear and equal bilaterally. No wheezes/rales/rhonchi. Gastrointestinal: Soft and nontender. No distention.  Genitourinary: Deferred Musculoskeletal: No deformity to extremities. Right leg with multiple abrasions and a skin evulsion. Compartments are soft in the upper and lower leg. Neurologic:  Normal speech and language. No gross focal neurologic deficits are appreciated.  Skin:  Skin is warm, dry. Multiple small abrasions to the inner right upper and lower leg. A larger roughly 2 cm in diameter skin evulsion  to the right medial calf. Hemostatic. Some subcutaneous fat noted. Psychiatric: Mood and affect are normal. Speech and behavior are normal. Patient exhibits appropriate insight and judgment.  ____________________________________________    LABS (pertinent positives/negatives)  None  ____________________________________________   EKG  None  ____________________________________________    RADIOLOGY  Tib/fib IMPRESSION: No fracture or radiopaque foreign body.  ____________________________________________   PROCEDURES  Procedure(s) performed: None  Critical Care performed: No  ____________________________________________   INITIAL IMPRESSION / ASSESSMENT AND PLAN / ED COURSE  Pertinent labs & imaging results that were available during my care of the patient were reviewed by me and considered in my medical decision making (see chart for details).  Patient presented to the emergency department today with concerns for laceration pain to her right leg after a brake rotor fell on it. Patient did suffer multiple small abrasions and a larger skin evulsion. On exam the skin avulsion would not be amenable to suturing given lack of skin. At this point will put dressing over it. Will give patient wound care center follow-up. We did update patient's tetanus in the emergency department today.  ____________________________________________   FINAL CLINICAL IMPRESSION(S) / ED DIAGNOSES  Final diagnoses:  Laceration     Phineas SemenGraydon Christin Mccreedy, MD 04/24/15 1319

## 2015-04-24 NOTE — ED Notes (Signed)
Pt arrived via EMS from home. Pt reports working on car this morning and a rotor fell on right leg. EMS reports laceration to right calf. EMS reports bleeding controlled and dressed wound. EMS reports 124/64, 97%RA, 80 HR.

## 2015-04-24 NOTE — ED Notes (Signed)
Pt in room laughing about use of marijuana and buying drug paraphernalia, pt appears in no acute distress at this time.

## 2015-04-24 NOTE — Discharge Instructions (Signed)
Please seek medical attention for any high fevers, chest pain, shortness of breath, change in behavior, persistent vomiting, bloody stool or any other new or concerning symptoms.  Deep Skin Avulsion A deep skin avulsion is a type of open wound. It often results from a severe injury (trauma) that tears away all layers of the skin or an entire body part. The areas of the body that are most often affected by a deep skin avulsion include the face, lips, ears, nose, and fingers. A deep skin avulsion may make structures below the skin become visible. You may be able to see muscle, bone, nerves, and blood vessels. A deep skin avulsion can also damage important structures beneath the skin. These include tendons, ligaments, nerves, or blood vessels. CAUSES Injuries that often cause a deep skin avulsion include:  Being crushed.  Falling against a jagged surface.  Animal bites.  Gunshot wounds.  Severe burns.  Injuries that involve being dragged, such as bicycle or motorcycle accidents. SYMPTOMS Symptoms of a deep skin avulsion include:  Pain.  Numbness.  Swelling.  A misshapen body part.  Bleeding, which may be heavy.  Fluid leaking from the wound. DIAGNOSIS This condition may be diagnosed with a medical history and physical exam. You may also have X-rays done. TREATMENT The treatment that is chosen for a deep skin avulsion depends on how large and deep the wound is and where it is located. Treatment for all types of avulsions usually starts with:  Controlling the bleeding.  Washing out the wound with a germ-free (sterile) salt-water solution.  Removing dead tissue from the wound. A wound may be closed or left open to heal. This depends on the size and location of the wound and whether it is likely to become infected. Wounds are usually covered or closed if they expose blood vessels, nerves, bone, or cartilage.  Wounds that are small and clean may be closed with stitches  (sutures).  Wounds that cannot be closed with sutures may be covered with a piece of skin (graft) or a skin flap. Skin may be taken from on or near the wound, from another part of the body, or from a donor.  Wounds may be left open if they are hard to close or they may become infected. These wounds heal over time from the bottom up. You may also receive medicine. This may include:  Antibiotics.  A tetanus shot.  Rabies vaccine. HOME CARE INSTRUCTIONS Medicines  Take or apply over-the-counter and prescription medicines only as told by your health care provider.  If you were prescribed an antibiotic, take or apply it as told by your health care provider. Do not stop taking the antibiotic even if your condition improves.  You may get anti-itch medicine while your wound is healing. Use it only as told by your health care provider. Wound Care  There are many ways to close and cover a wound. For example, a wound can be covered with sutures, skin glue, or adhesive strips. Follow instructions from your health care provider about:  How to take care of your wound.  When and how you should change your bandage (dressing).  When you should remove your dressing.  Removing whatever was used to close your wound.  Keep the dressing dry as told by your health care provider. Do not take baths, swim, use a hot tub, or do anything that would put your wound underwater until your health care provider approves.  Clean the wound each day or as told  by your health care provider.  Wash the wound with mild soap and water.  Rinse the wound with water to remove all soap.  Pat the wound dry with a clean towel. Do not rub it.  Do not scratch or pick at the wound.  Check your wound every day for signs of infection. Watch for:  Redness, swelling, or pain.  Fluid, blood, or pus. General Instructions  Raise (elevate) the injured area above the level of your heart while you are sitting or lying  down.  Keep all follow-up visits as told by your health care provider. This is important. SEEK MEDICAL CARE IF:  You received a tetanus shot and you have swelling, severe pain, redness, or bleeding at the injection site.  You have a fever.  Your pain is not controlled with medicine.  You have increased redness, swelling, or pain at the site of your wound.  You have fluid, blood, or pus coming from your wound.  You notice a bad smell coming from your wound or your dressing.  A wound that was closed breaks open.  You notice something coming out of the wound, such as wood or glass.  You notice a change in the color of your skin near your wound.  You develop a new rash.  You need to change the dressing frequently due to fluid, blood, or pus draining from the wound. SEEK IMMEDIATE MEDICAL CARE IF:  Your pain suddenly increases and is severe.  You develop severe swelling around the wound.  You develop numbness around the wound.  You have nausea and vomiting that does not go away after 24 hours.  You feel light-headed, weak, or faint.  You develop chest pain.  You have trouble breathing.  Your wound is on your hand or foot and you cannot properly move a finger or toe.  The wound is on your hand or foot and you notice that your fingers or toes look pale or bluish.  You have a red streak going away from your wound.   This information is not intended to replace advice given to you by your health care provider. Make sure you discuss any questions you have with your health care provider.   Document Released: 02/16/2006 Document Revised: 05/07/2014 Document Reviewed: 12/26/2013 Elsevier Interactive Patient Education [employer]

## 2015-05-09 ENCOUNTER — Ambulatory Visit: Payer: Self-pay | Admitting: Surgery

## 2015-05-15 ENCOUNTER — Emergency Department
Admission: EM | Admit: 2015-05-15 | Discharge: 2015-05-15 | Disposition: A | Discharge status: Home / Self Care | Payer: Self-pay | Attending: Emergency Medicine | Admitting: Emergency Medicine

## 2015-05-15 ENCOUNTER — Emergency Department: Payer: Self-pay

## 2015-05-15 DIAGNOSIS — J45909 Unspecified asthma, uncomplicated: Secondary | ICD-10-CM | POA: Insufficient documentation

## 2015-05-15 DIAGNOSIS — F17210 Nicotine dependence, cigarettes, uncomplicated: Secondary | ICD-10-CM | POA: Insufficient documentation

## 2015-05-15 DIAGNOSIS — Y999 Unspecified external cause status: Secondary | ICD-10-CM | POA: Insufficient documentation

## 2015-05-15 DIAGNOSIS — Y939 Activity, unspecified: Secondary | ICD-10-CM | POA: Insufficient documentation

## 2015-05-15 DIAGNOSIS — S81801A Unspecified open wound, right lower leg, initial encounter: Secondary | ICD-10-CM | POA: Insufficient documentation

## 2015-05-15 DIAGNOSIS — Y929 Unspecified place or not applicable: Secondary | ICD-10-CM | POA: Insufficient documentation

## 2015-05-15 DIAGNOSIS — W208XXA Other cause of strike by thrown, projected or falling object, initial encounter: Secondary | ICD-10-CM | POA: Insufficient documentation

## 2015-05-15 LAB — CBC WITH DIFFERENTIAL/PLATELET
Basophils Absolute: 0 10*3/uL (ref 0–0.1)
Eosinophils Absolute: 0 10*3/uL (ref 0–0.7)
HEMATOCRIT: 40 % (ref 35.0–47.0)
Hemoglobin: 13 g/dL (ref 12.0–16.0)
Lymphs Abs: 1.6 10*3/uL (ref 1.0–3.6)
MCH: 26.5 pg (ref 26.0–34.0)
MCHC: 32.4 g/dL (ref 32.0–36.0)
MCV: 81.8 fL (ref 80.0–100.0)
Monocytes Absolute: 0.6 10*3/uL (ref 0.2–0.9)
NEUTROS ABS: 5.4 10*3/uL (ref 1.4–6.5)
Neutrophils Relative %: 71 %
PLATELETS: 238 10*3/uL (ref 150–440)
RBC: 4.89 MIL/uL (ref 3.80–5.20)
RDW: 14.1 % (ref 11.5–14.5)
WBC: 7.7 10*3/uL (ref 3.6–11.0)

## 2015-05-15 MED ORDER — SULFAMETHOXAZOLE-TRIMETHOPRIM 800-160 MG PO TABS
1.0000 | ORAL_TABLET | Freq: Once | ORAL | Status: AC
  Administered 2015-05-15: 1 via ORAL
  Filled 2015-05-15: qty 1

## 2015-05-15 MED ORDER — HYDROCODONE-ACETAMINOPHEN 5-325 MG PO TABS: 1.0000 | ORAL_TABLET | ORAL | Status: DC | PRN

## 2015-05-15 MED ORDER — SULFAMETHOXAZOLE-TRIMETHOPRIM 800-160 MG PO TABS: 1.0000 | ORAL_TABLET | Freq: Two times a day (BID) | ORAL | Status: DC

## 2015-05-15 MED ORDER — BACITRACIN ZINC 500 UNIT/GM EX OINT
TOPICAL_OINTMENT | Freq: Two times a day (BID) | CUTANEOUS | Status: DC
  Administered 2015-05-15: 1 via TOPICAL
  Filled 2015-05-15: qty 0.9
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
## 2015-05-15 NOTE — Discharge Instructions (Signed)
Wound Care °Taking care of your wound properly can help to prevent pain and infection. It can also help your wound to heal more quickly.  °HOW TO CARE FOR YOUR WOUND  °· Take or apply over-the-counter and prescription medicines only as told by your health care provider. °· If you were prescribed antibiotic medicine, take or apply it as told by your health care provider. Do not stop using the antibiotic even if your condition improves. °· Clean the wound each day or as told by your health care provider. °¨ Wash the wound with mild soap and water. °¨ Rinse the wound with water to remove all soap. °¨ Pat the wound dry with a clean towel. Do not rub it. °· There are many different ways to close and cover a wound. For example, a wound can be covered with stitches (sutures), skin glue, or adhesive strips. Follow instructions from your health care provider about: °¨ How to take care of your wound. °¨ When and how you should change your bandage (dressing). °¨ When you should remove your dressing. °¨ Removing whatever was used to close your wound. °· Check your wound every day for signs of infection. Watch for: °¨ Redness, swelling, or pain. °¨ Fluid, blood, or pus. °· Keep the dressing dry until your health care provider says it can be removed. Do not take baths, swim, use a hot tub, or do anything that would put your wound underwater until your health care provider approves. °· Raise (elevate) the injured area above the level of your heart while you are sitting or lying down. °· Do not scratch or pick at the wound. °· Keep all follow-up visits as told by your health care provider. This is important. °SEEK MEDICAL CARE IF: °· You received a tetanus shot and you have swelling, severe pain, redness, or bleeding at the injection site. °· You have a fever. °· Your pain is not controlled with medicine. °· You have increased redness, swelling, or pain at the site of your wound. °· You have fluid, blood, or pus coming from your  wound. °· You notice a bad smell coming from your wound or your dressing. °SEEK IMMEDIATE MEDICAL CARE IF: °· You have a red streak going away from your wound. °  °This information is not intended to replace advice given to you by your health care provider. Make sure you discuss any questions you have with your health care provider. °  °Document Released: 09/30/2007 Document Revised: 05/07/2014 Document Reviewed: 12/17/2013 °Elsevier Interactive Patient Education ©2016 [employer] ° °

## 2015-05-15 NOTE — ED Provider Notes (Signed)
[facility] Emergency Department Provider Note  ____________________________________________  Time seen: Approximately 8:15 PM  I have reviewed the triage vital signs and the nursing notes.   HISTORY  Chief Complaint Wound Check    HPI Tina Ibarra is a 25 y.o. female presents for evaluation of wound check to right lower leg from 21 days ago. Patient states that she never followed up to the wound clinic at that time. She initially suffered a laceration to her leg which was nonsuturable secondary to the width. The patient has since noticed a foul-smelling odor coming from the wound.   Past Medical History  Diagnosis Date  . Asthma     There are no active problems to display for this patient.   No past surgical history on file.  Current Outpatient Rx  Name  Route  Sig  Dispense  Refill  . HYDROcodone-acetaminophen (NORCO) 5-325 MG tablet   Oral   Take 1-2 tablets by mouth every 4 (four) hours as needed for moderate pain.   15 tablet   0   . sulfamethoxazole-trimethoprim (BACTRIM DS,SEPTRA DS) 800-160 MG tablet   Oral   Take 1 tablet by mouth 2 (two) times daily.   20 tablet   0     Allergies Mushroom extract complex  No family history on file.  Social History Social History  Substance Use Topics  . Smoking status: Current Every Day Smoker    Types: Cigarettes  . Smokeless tobacco: Not on file  . Alcohol Use: Yes    Review of Systems Constitutional: No fever/chills Musculoskeletal: Negative for back pain. Skin: Positive for open wound to the right lower leg on the medial aspect. Neurological: Negative for headaches, focal weakness or numbness.  10-point ROS otherwise negative.  ____________________________________________   PHYSICAL EXAM:  VITAL SIGNS: ED Triage Vitals  Enc Vitals Group     BP 05/15/15 1946 119/78 mmHg     Pulse Rate 05/15/15 1946 71     Resp 05/15/15 1946 18     Temp 05/15/15 1946 99 F (37.2 C)      Temp Source 05/15/15 1946 Oral     SpO2 05/15/15 1946 100 %     Weight 05/15/15 1946 231 lb (104.781 kg)     Height 05/15/15 1946  (1.651 m)     Head Cir --      Peak Flow --      Pain Score 05/15/15 1947 0     Pain Loc --      Pain Edu? --      Excl. in GC? --     Constitutional: Alert and oriented. Well appearing and in no acute distress. Musculoskeletal: No lower extremity tenderness nor edema.  No joint effusions.Open wound on right leg is more towards the calf and not near the tib-fib area. Neurologic:  Normal speech and language. No gross focal neurologic deficits are appreciated. No gait instability. Skin:  Open wound 2 cm x 4 7 m and approximately half centimeter deep, picture taken. No obvious drainage. Psychiatric: Mood and affect are normal. Speech and behavior are normal.  ____________________________________________   LABS (all labs ordered are listed, but only abnormal results are displayed)  Labs Reviewed  CBC WITH DIFFERENTIAL/PLATELET   ____________________________________________    RADIOLOGY  Based on the location of the wound no radiological x-rays were taken at this visit. ____________________________________________   PROCEDURES  Procedure(s) performed: None  Critical Care performed: No  ____________________________________________   INITIAL IMPRESSION /  ASSESSMENT AND PLAN / ED COURSE  Pertinent labs & imaging results that were available during my care of the patient were reviewed by me and considered in my medical decision making (see chart for details).  Wound right lower leg nonhealing. Encouraged patient to call the wound clinic tomorrow for follow-up care. Rx given for Bactrim DS twice a day #20. She voices no other emergency medical complaints at this time. ____________________________________________   FINAL CLINICAL IMPRESSION(S) / ED DIAGNOSES  Final diagnoses:  Leg wound, right, initial encounter     This chart  was dictated using voice recognition software/Dragon. Despite best efforts to proofread, errors can occur which can change the meaning. Any change was purely unintentional.   Evangeline Dakinharles M Chara Marquard, PA-C 05/15/15 2229  Jene Everyobert Kinner, MD 05/15/15 2242

## 2015-05-16 ENCOUNTER — Encounter: Payer: Self-pay | Attending: Surgery | Admitting: Surgery

## 2015-05-16 DIAGNOSIS — S81811A Laceration without foreign body, right lower leg, initial encounter: Secondary | ICD-10-CM | POA: Insufficient documentation

## 2015-05-16 DIAGNOSIS — F17218 Nicotine dependence, cigarettes, with other nicotine-induced disorders: Secondary | ICD-10-CM | POA: Insufficient documentation

## 2015-05-16 DIAGNOSIS — X58XXXA Exposure to other specified factors, initial encounter: Secondary | ICD-10-CM | POA: Insufficient documentation

## 2015-05-16 DIAGNOSIS — J45909 Unspecified asthma, uncomplicated: Secondary | ICD-10-CM | POA: Insufficient documentation

## 2015-05-16 NOTE — Progress Notes (Addendum)
FALICITY, SHEETS ([id number]) Visit Report for 05/16/2015 Chief Complaint Document Details Patient Name: Tina Ibarra, Tina Ibarra 05/16/2015 10:45 Date of Service: AM Medical Record [id number] Number: Patient Account Number: [id number] December 03, 1990 (25 y.o. Treating RN: Huel Coventry Date of Birth/Sex: Female) Other Clinician: Primary Care Physician: PATIENT, NO Treating Dyane Broberg Referring Physician: Jene Every Physician/Extender: Weeks in Treatment: 0 Information Obtained from: Patient Chief Complaint Patient seen for complaints of Non-Healing Wound to the right lower extremity which she's had for about 3 weeks Electronic Signature(s) Signed: 05/16/2015 11:47:14 AM By: Evlyn Kanner MD, FACS Entered By: Evlyn Kanner on 05/16/2015 11:47:14 Orlan Leavens ([id number]) -------------------------------------------------------------------------------- Debridement Details Patient Name: Tina, Ibarra 05/16/2015 10:45 Date of Service: AM Medical Record [id number] Number: Patient Account Number: [id number] [birth year] (25 y.o. Treating RN: Huel Coventry Date of Birth/Sex: Female) Other Clinician: Primary Care Physician: PATIENT, NO Treating Kerina Simoneau Referring Physician: Jene Every Physician/Extender: Weeks in Treatment: 0 Debridement Performed for Wound #1 Right,Proximal,Medial,Anterior Lower Leg Assessment: Performed By: Physician Evlyn Kanner, MD Debridement: Debridement Pre-procedure Yes Verification/Time Out Taken: Start Time: 11:40 Pain Control: Other : lidocaine 4% Level: Skin/Subcutaneous Tissue Total Area Debrided (L x 2.4 (cm) x 4 (cm) = 9.6 (cm) W): Tissue and other Viable, Non-Viable, Eschar, Exudate, Fat, Fibrin/Slough, Subcutaneous material debrided: Instrument: Forceps, Scissors Bleeding: Minimum Hemostasis Achieved: Pressure End Time: 11:45 Procedural Pain: 0 Post Procedural Pain: 0 Response to Treatment: Procedure was tolerated well Post  Debridement Measurements of Total Wound Length: (cm) 2.4 Width: (cm) 4 Depth: (cm) 1.1 Volume: (cm) 8.294 Post Procedure Diagnosis Same as Pre-procedure Electronic Signature(s) Signed: 05/16/2015 11:46:52 AM By: Evlyn Kanner MD, FACS Signed: 05/16/2015 5:29:39 PM By: Elliot Gurney RN, BSN, Kim RN, BSN Entered By: Evlyn Kanner on 05/16/2015 11:46:51 Mousel, Devoria Glassing ([id number]) -------------------------------------------------------------------------------- HPI Details Patient Name: Tina, Ibarra 05/16/2015 10:45 Date of Service: AM Medical Record [id number] Number: Patient Account Number: [id number] 1990/07/29 (25 y.o. Treating RN: Huel Coventry Date of Birth/Sex: Female) Other Clinician: Primary Care Physician: PATIENT, NO Treating Daschel Roughton Referring Physician: Jene Every Physician/Extender: Weeks in Treatment: 0 History of Present Illness Location: lacerated wound to the right lower extremity Quality: Patient reports experiencing a sharp pain to affected area(s). Severity: Patient states wound are getting worse. Duration: Patient has had the wound for < 3 weeks prior to presenting for treatment Timing: Pain in wound is Intermittent (comes and goes Context: The wound occurred when the patient he dropped a heavy object on her right leg while she was working on a car Modifying Factors: Other treatment(s) tried include:was applying local care but the wound got infected and she was in the ER yesterday and put on Bactrim for 10 days Associated Signs and Symptoms: Patient reports having foul odor. HPI Description: in detail patient was seen in the ER yesterday after 3 weeks of having had a laceration to her leg which was nonsuturable secondary to the width. The patient noticed a foul odor coming from the wound. was seen in the ER put on Bactrim DS for 10 days and referred to the wound clinic again. her original injury was on 04/24/2015 when she was working on her car and the  brake rotor fell on her right leg and caused a laceration. she is a smoker and smokes cigarettes daily. x-ray of the leg showed no fracture or radiopaque foreign bodies. At that stage the wound could not be sutured because of the avulsion of the skin and a dressing was placed and was given a tetanus toxoid and  discharged. Electronic Signature(s) Signed: 05/16/2015 11:48:17 AM By: Evlyn Kanner MD, FACS Previous Signature: 05/16/2015 11:10:45 AM Version By: Evlyn Kanner MD, FACS Entered By: Evlyn Kanner on 05/16/2015 11:48:16 SPECIAL, RANES ([id number]) -------------------------------------------------------------------------------- Physical Exam Details Patient Name: Tina, Ibarra 05/16/2015 10:45 Date of Service: AM Medical Record [id number] Number: Patient Account Number: [id number] [birth year] (25 y.o. Treating RN: Huel Coventry Date of Birth/Sex: Female) Other Clinician: Primary Care Physician: PATIENT, NO Treating Dillinger Aston Referring Physician: Jene Every Physician/Extender: Weeks in Treatment: 0 Constitutional . Pulse regular. Respirations normal and unlabored. Afebrile. . Eyes Nonicteric. Reactive to light. Ears, Nose, Mouth, and Throat Lips, teeth, and gums WNL.Marland Kitchen Moist mucosa without lesions. Neck supple and nontender. No palpable supraclavicular or cervical adenopathy. Normal sized without goiter. Respiratory WNL. No retractions.. Cardiovascular Pedal Pulses WNL. ABI on the right was 1.2. No clubbing, cyanosis or edema. Gastrointestinal (GI) Abdomen without masses or tenderness.. No liver or spleen enlargement or tenderness.. Lymphatic No adneopathy. No adenopathy. No adenopathy. Musculoskeletal Adexa without tenderness or enlargement.. Digits and nails w/o clubbing, cyanosis, infection, petechiae, ischemia, or inflammatory conditions.. Integumentary (Hair, Skin) No suspicious lesions. No crepitus or fluctuance. No peri-wound warmth or erythema. No  masses.Marland Kitchen Psychiatric Judgement and insight Intact.. No evidence of depression, anxiety, or agitation.. Notes she has a fairly deep wound on the right lower extremity which has a lot of necrotic debris at the base and this is down to subcutaneous tissue. It has a foul odor. After profusely washing it with saline and using forceps and scissors I have debrided as much as is possible. Minimal bleeding controlled with pressure Electronic Signature(s) Signed: 05/16/2015 11:49:17 AM By: Evlyn Kanner MD, FACS Entered By: Evlyn Kanner on 05/16/2015 11:49:17 Orlan Leavens ([id number]) -------------------------------------------------------------------------------- Physician Orders Details Patient Name: ULANDA, TACKETT 05/16/2015 10:45 Date of Service: AM Medical Record [id number] Number: Patient Account Number: [id number] [birth year] (24 y.o. Treating RN: Huel Coventry Date of Birth/Sex: Female) Other Clinician: Primary Care Physician: PATIENT, NO Treating Dearies Meikle Referring Physician: Jene Every Physician/Extender: Tania Ade in Treatment: 0 Verbal / Phone Orders: Yes Clinician: Huel Coventry Read Back and Verified: Yes Diagnosis Coding Wound Cleansing Wound #1 Right,Proximal,Medial,Anterior Lower Leg o Clean wound with Normal Saline. Wound #2 Right,Distal,Medial,Anterior Lower Leg o Clean wound with Normal Saline. Anesthetic Wound #1 Right,Proximal,Medial,Anterior Lower Leg o Topical Lidocaine 4% cream applied to wound bed prior to debridement Wound #2 Right,Distal,Medial,Anterior Lower Leg o Topical Lidocaine 4% cream applied to wound bed prior to debridement Primary Wound Dressing Wound #1 Right,Proximal,Medial,Anterior Lower Leg o Medihoney gel Wound #2 Right,Distal,Medial,Anterior Lower Leg o Medihoney gel Secondary Dressing Wound #1 Right,Proximal,Medial,Anterior Lower Leg o Non-adherent pad o Conform/Kerlix Wound #2 Right,Distal,Medial,Anterior Lower  Leg o Non-adherent pad o Conform/Kerlix Dressing Change Frequency Wound #1 Right,Proximal,Medial,Anterior Lower Leg o Change dressing every day. MORNINGSTAR, TOFT ([id number]) Wound #2 Right,Distal,Medial,Anterior Lower Leg o Change dressing every day. Follow-up Appointments Wound #1 Right,Proximal,Medial,Anterior Lower Leg o Return Appointment in 1 week. Wound #2 Right,Distal,Medial,Anterior Lower Leg o Return Appointment in 1 week. Edema Control Wound #1 Right,Proximal,Medial,Anterior Lower Leg o Elevate legs to the level of the heart and pump ankles as often as possible Wound #2 Right,Distal,Medial,Anterior Lower Leg o Elevate legs to the level of the heart and pump ankles as often as possible Additional Orders / Instructions Wound #1 Right,Proximal,Medial,Anterior Lower Leg o Stop Smoking o Increase protein intake. o Activity as tolerated Wound #2 Right,Distal,Medial,Anterior Lower Leg o Stop Smoking o Increase protein intake. o Activity as tolerated Medications-please add  to medication list. Wound #1 Right,Proximal,Medial,Anterior Lower Leg o P.O. Antibiotics - fill and begin prescription given in ER o Other: - Medihoney Wound #2 Right,Distal,Medial,Anterior Lower Leg o P.O. Antibiotics - fill and begin prescription given in ER o Other: - Medihoney Electronic Signature(s) Signed: 05/16/2015 3:59:17 PM By: Evlyn KannerBritto, Elohim Brune MD, FACS Signed: 05/16/2015 5:29:39 PM By: Elliot GurneyWoody, RN, BSN, Kim RN, BSN Entered By: Elliot GurneyWoody, RN, BSN, Kim on 05/16/2015 11:46:58 Griesinger, Devoria GlassingJASMINE N. ([id number]) -------------------------------------------------------------------------------- Problem List Details Patient Name: Orlan LeavensBURTON, Jinnifer N. 05/16/2015 10:45 Date of Service: AM Medical Record [id number] Number: Patient Account Number: [id number] 31-Aug-1990 (24 y.o. Treating RN: Huel CoventryWoody, Kim Date of Birth/Sex: Female) Other Clinician: Primary Care Physician: PATIENT, NO  Treating Mihran Lebarron Referring Physician: Jene EveryKINNER, ROBERT Physician/Extender: Weeks in Treatment: 0 Active Problems ICD-10 Encounter Code Description Active Date Diagnosis S81.811A Laceration without foreign body, right lower leg, initial 05/16/2015 Yes encounter F17.218 Nicotine dependence, cigarettes, with other nicotine- 05/16/2015 Yes induced disorders Inactive Problems Resolved Problems Electronic Signature(s) Signed: 05/16/2015 11:46:36 AM By: Evlyn KannerBritto, Henryetta Corriveau MD, FACS Entered By: Evlyn KannerBritto, Arden Tinoco on 05/16/2015 11:46:36 Caras, Devoria GlassingJASMINE N. ([id number]) -------------------------------------------------------------------------------- Progress Note Details Patient Name: Orlan LeavensBURTON, Findley N. 05/16/2015 10:45 Date of Service: AM Medical Record [id number] Number: Patient Account Number: [id number] 31-Aug-1990 (24 y.o. Treating RN: Huel CoventryWoody, Kim Date of Birth/Sex: Female) Other Clinician: Primary Care Physician: PATIENT, NO Treating Rozlyn Yerby Referring Physician: Jene EveryKINNER, ROBERT Physician/Extender: Weeks in Treatment: 0 Subjective Chief Complaint Information obtained from Patient Patient seen for complaints of Non-Healing Wound to the right lower extremity which she's had for about 3 weeks History of Present Illness (HPI) The following HPI elements were documented for the patient's wound: Location: lacerated wound to the right lower extremity Quality: Patient reports experiencing a sharp pain to affected area(s). Severity: Patient states wound are getting worse. Duration: Patient has had the wound for < 3 weeks prior to presenting for treatment Timing: Pain in wound is Intermittent (comes and goes Context: The wound occurred when the patient he dropped a heavy object on her right leg while she was working on a car Modifying Factors: Other treatment(s) tried include:was applying local care but the wound got infected and she was in the ER yesterday and put on Bactrim for 10  days Associated Signs and Symptoms: Patient reports having foul odor. in detail patient was seen in the ER yesterday after 3 weeks of having had a laceration to her leg which was nonsuturable secondary to the width. The patient noticed a foul odor coming from the wound. was seen in the ER put on Bactrim DS for 10 days and referred to the wound clinic again. her original injury was on 04/24/2015 when she was working on her car and the brake rotor fell on her right leg and caused a laceration. she is a smoker and smokes cigarettes daily. x-ray of the leg showed no fracture or radiopaque foreign bodies. At that stage the wound could not be sutured because of the avulsion of the skin and a dressing was placed and was given a tetanus toxoid and discharged. Wound History Patient presents with 2 open wounds that have been present for approximately 3 weeks. Patient has been treating wounds in the following manner: neosporin and cover bandage. Laboratory tests have been performed in the last month. Patient reportedly has not tested positive for an antibiotic resistant organism. Patient reportedly has not tested positive for osteomyelitis. Patient reportedly has not had testing performed to evaluate circulation in the legs. Patient experiences the following problems associated  with their wounds: infection, swelling. CONSTANZA, MINCY ([id number]) Patient History Information obtained from Patient, Chart. Allergies mushrooms, bee sting Family History No family history of Cancer, Diabetes, Heart Disease, Hereditary Spherocytosis, Hypertension, Kidney Disease, Lung Disease, Seizures, Stroke, Thyroid Problems, Tuberculosis. Social History Current every day smoker - 4-5 daily, Marital Status - Single, Alcohol Use - Rarely, Drug Use - No History, Caffeine Use - Daily. Medical History Eyes Denies history of Cataracts, Glaucoma, Optic Neuritis Ear/Nose/Mouth/Throat Denies history of Chronic sinus  problems/congestion, Middle ear problems Hematologic/Lymphatic Denies history of Anemia, Hemophilia, Human Immunodeficiency Virus, Lymphedema Respiratory Patient has history of Asthma Denies history of Aspiration, Chronic Obstructive Pulmonary Disease (COPD), Pneumothorax, Sleep Apnea, Tuberculosis Cardiovascular Denies history of Angina, Arrhythmia, Congestive Heart Failure, Coronary Artery Disease, Deep Vein Thrombosis, Hypertension, Hypotension, Myocardial Infarction, Peripheral Arterial Disease, Peripheral Venous Disease, Phlebitis, Vasculitis Gastrointestinal Denies history of Cirrhosis , Colitis, Crohn s, Hepatitis A, Hepatitis B, Hepatitis C Endocrine Denies history of Type I Diabetes, Type II Diabetes Genitourinary Denies history of End Stage Renal Disease Immunological Denies history of Lupus Erythematosus, Raynaud s, Scleroderma Integumentary (Skin) Denies history of History of Burn, History of pressure wounds Musculoskeletal Denies history of Gout, Rheumatoid Arthritis, Osteoarthritis, Osteomyelitis Neurologic Denies history of Dementia, Neuropathy, Quadriplegia, Paraplegia, Seizure Disorder Oncologic Denies history of Received Chemotherapy, Received Radiation Psychiatric Denies history of Anorexia/bulimia, Confinement Anxiety Review of Systems (ROS) Constitutional Symptoms (General Health) Rineer, Lynna N. ([id number]) The patient has no complaints or symptoms. Eyes The patient has no complaints or symptoms. Ear/Nose/Mouth/Throat The patient has no complaints or symptoms. Hematologic/Lymphatic The patient has no complaints or symptoms. Respiratory The patient has no complaints or symptoms. Cardiovascular The patient has no complaints or symptoms. Gastrointestinal The patient has no complaints or symptoms. Endocrine The patient has no complaints or symptoms. Genitourinary The patient has no complaints or symptoms. Immunological The patient has no  complaints or symptoms. Integumentary (Skin) Complains or has symptoms of Wounds. Denies complaints or symptoms of Bleeding or bruising tendency, Breakdown, Swelling. Musculoskeletal The patient has no complaints or symptoms. Neurologic The patient has no complaints or symptoms. Oncologic The patient has no complaints or symptoms. Psychiatric The patient has no complaints or symptoms. Medications Bactrim DS 800 mg-160 mg tablet oral 1 tablet oral twice daily hydrocodone 5 mg-acetaminophen 325 mg tablet oral 1-2 tablet(s) oral every 4 hours for moderate pain Objective Constitutional Pulse regular. Respirations normal and unlabored. Afebrile. Vitals Time Taken: 10:47 AM, Height: 65 in, Weight: 231 lbs, BMI: 38.4, Temperature: 98.1 F, Pulse: 70 bpm, Respiratory Rate: 18 breaths/min, Blood Pressure: 115/64 mmHg. GAYNEL, SCHAAFSMA N. ([id number]) Eyes Nonicteric. Reactive to light. Ears, Nose, Mouth, and Throat Lips, teeth, and gums WNL.Marland Kitchen Moist mucosa without lesions. Neck supple and nontender. No palpable supraclavicular or cervical adenopathy. Normal sized without goiter. Respiratory WNL. No retractions.. Cardiovascular Pedal Pulses WNL. ABI on the right was 1.2. No clubbing, cyanosis or edema. Gastrointestinal (GI) Abdomen without masses or tenderness.. No liver or spleen enlargement or tenderness.. Lymphatic No adneopathy. No adenopathy. No adenopathy. Musculoskeletal Adexa without tenderness or enlargement.. Digits and nails w/o clubbing, cyanosis, infection, petechiae, ischemia, or inflammatory conditions.Marland Kitchen Psychiatric Judgement and insight Intact.. No evidence of depression, anxiety, or agitation.. General Notes: she has a fairly deep wound on the right lower extremity which has a lot of necrotic debris at the base and this is down to subcutaneous tissue. It has a foul odor. After profusely washing it with saline and using forceps and scissors I have debrided as much as  is  possible. Minimal bleeding controlled with pressure Integumentary (Hair, Skin) No suspicious lesions. No crepitus or fluctuance. No peri-wound warmth or erythema. No masses.. Wound #1 status is Open. Original cause of wound was Trauma. The wound is located on the Right,Proximal,Medial,Anterior Lower Leg. The wound measures 2.5cm length x 4cm width x 1.1cm depth; 7.854cm^2 area and 8.639cm^3 volume. The wound is limited to skin breakdown. There is a medium amount of serosanguineous drainage noted. The wound margin is epibole. There is no granulation within the wound bed. There is a large (67-100%) amount of necrotic tissue within the wound bed including Eschar. The periwound skin appearance exhibited: Hemosiderin Staining. The periwound skin appearance did not exhibit: Callus, Crepitus, Excoriation, Fluctuance, Friable, Induration, Localized Edema, Rash, Scarring, Dry/Scaly, Maceration, Moist, Atrophie Blanche, Cyanosis, Ecchymosis, Mottled, Pallor, Rubor, Erythema. Wound #2 status is Open. Original cause of wound was Trauma. The wound is located on the Right,Distal,Medial,Anterior Lower Leg. The wound measures 0.1cm length x 0.1cm width x 0.1cm depth; Cacciola, Raelie N. ([id number]) 0.008cm^2 area and 0.001cm^3 volume. The wound is limited to skin breakdown. There is a none present amount of drainage noted. The wound margin is indistinct and nonvisible. There is no granulation within the wound bed. There is a large (67-100%) amount of necrotic tissue within the wound bed including Eschar. The periwound skin appearance exhibited: Dry/Scaly. The periwound skin appearance did not exhibit: Callus, Crepitus, Excoriation, Fluctuance, Friable, Induration, Localized Edema, Rash, Scarring, Maceration, Moist, Atrophie Blanche, Cyanosis, Ecchymosis, Hemosiderin Staining, Mottled, Pallor, Rubor, Erythema. Assessment Active Problems ICD-10 S81.811A - Laceration without foreign body, right lower leg,  initial encounter F17.218 - Nicotine dependence, cigarettes, with other nicotine-induced disorders this 25 year old patient who has had a lacerated wound to the right lower extremity for about 3 weeks has not looked after this very well and it got infected with appropriate antibiotic being started yesterday. I have recommended: 1. Medihoney to be applied daily and a bordered foam about this. 2. May shower and wash with soap and water daily 3. Completely give up smoking and I have discussed this in great detail with her and spent over 3 minutes talking about this she says she'll be compliant 4. Regular visits to the wound center Procedures Wound #1 Wound #1 is a Trauma, Other located on the Right,Proximal,Medial,Anterior Lower Leg . There was a Skin/Subcutaneous Tissue Debridement ([id number]) debridement with total area of 9.6 sq cm performed by Evlyn Kanner, MD. with the following instrument(s): Forceps and Scissors to remove Viable and Non- Viable tissue/material including Exudate, Fat, Fibrin/Slough, Eschar, and Subcutaneous after achieving pain control using Other (lidocaine 4%). A time out was conducted prior to the start of the procedure. A Minimum amount of bleeding was controlled with Pressure. The procedure was tolerated well with a pain level of 0 throughout and a pain level of 0 following the procedure. Post Debridement Measurements: 2.4cm length x 4cm width x 1.1cm depth; 8.294cm^3 volume. Post procedure Diagnosis Wound #1: Same as Pre-Procedure Rohlman, Ghada N. ([id number]) Plan Wound Cleansing: Wound #1 Right,Proximal,Medial,Anterior Lower Leg: Clean wound with Normal Saline. Wound #2 Right,Distal,Medial,Anterior Lower Leg: Clean wound with Normal Saline. Anesthetic: Wound #1 Right,Proximal,Medial,Anterior Lower Leg: Topical Lidocaine 4% cream applied to wound bed prior to debridement Wound #2 Right,Distal,Medial,Anterior Lower Leg: Topical Lidocaine 4% cream applied  to wound bed prior to debridement Primary Wound Dressing: Wound #1 Right,Proximal,Medial,Anterior Lower Leg: Medihoney gel Wound #2 Right,Distal,Medial,Anterior Lower Leg: Medihoney gel Secondary Dressing: Wound #1 Right,Proximal,Medial,Anterior Lower Leg: Non-adherent pad Conform/Kerlix Wound #2 Right,Distal,Medial,Anterior  Lower Leg: Non-adherent pad Conform/Kerlix Dressing Change Frequency: Wound #1 Right,Proximal,Medial,Anterior Lower Leg: Change dressing every day. Wound #2 Right,Distal,Medial,Anterior Lower Leg: Change dressing every day. Follow-up Appointments: Wound #1 Right,Proximal,Medial,Anterior Lower Leg: Return Appointment in 1 week. Wound #2 Right,Distal,Medial,Anterior Lower Leg: Return Appointment in 1 week. Edema Control: Wound #1 Right,Proximal,Medial,Anterior Lower Leg: Elevate legs to the level of the heart and pump ankles as often as possible Wound #2 Right,Distal,Medial,Anterior Lower Leg: Elevate legs to the level of the heart and pump ankles as often as possible Additional Orders / Instructions: Wound #1 Right,Proximal,Medial,Anterior Lower Leg: Stop Smoking Increase protein intake. Activity as tolerated Wound #2 Right,Distal,Medial,Anterior Lower Leg: Stop Smoking Increase protein intake. CHEQUITA, MOFIELD ([id number]) Activity as tolerated Medications-please add to medication list.: Wound #1 Right,Proximal,Medial,Anterior Lower Leg: P.O. Antibiotics - fill and begin prescription given in ER Other: - Medihoney Wound #2 Right,Distal,Medial,Anterior Lower Leg: P.O. Antibiotics - fill and begin prescription given in ER Other: - Medihoney this 25 year old patient who has had a lacerated wound to the right lower extremity for about 3 weeks has not looked after this very well and it got infected with appropriate antibiotic being started yesterday. I have recommended: 1. Medihoney to be applied daily and a bordered foam about this. 2. May shower and  wash with soap and water daily 3. Completely give up smoking and I have discussed this in great detail with her and spent over 3 minutes talking about this she says she'll be compliant 4. Regular visits to the wound center Electronic Signature(s) Signed: 05/16/2015 11:50:55 AM By: Evlyn Kanner MD, FACS Entered By: Evlyn Kanner on 05/16/2015 11:50:54 Orlan Leavens ([id number]) -------------------------------------------------------------------------------- ROS/PFSH Details Patient Name: PREET, MANGANO 05/16/2015 10:45 Date of Service: AM Medical Record [id number] Number: Patient Account Number: [id number] 09/16/1990 (24 y.o. Treating RN: Huel Coventry Date of Birth/Sex: Female) Other Clinician: Primary Care Physician: PATIENT, NO Treating Chazlyn Cude Referring Physician: Jene Every Physician/Extender: Weeks in Treatment: 0 Information Obtained From Patient Chart Wound History Do you currently have one or more open woundso Yes How many open wounds do you currently haveo 2 Approximately how long have you had your woundso 3 weeks How have you been treating your wound(s) until nowo neosporin and cover bandage Has your wound(s) ever healed and then re-openedo No Have you had any lab work done in the past montho Yes Who ordered the lab work [employer] Have you tested positive for an antibiotic resistant organism (MRSA, No VRE)o Have you tested positive for osteomyelitis (bone infection)o No Have you had any tests for circulation on your legso No Have you had other problems associated with your woundso Infection, Swelling Cardiovascular Complaints and Symptoms: No Complaints or Symptoms Complaints and Symptoms: Negative for: Chest pain; LE edema Medical History: Negative for: Angina; Arrhythmia; Congestive Heart Failure; Coronary Artery Disease; Deep Vein Thrombosis; Hypertension; Hypotension; Myocardial Infarction; Peripheral Arterial Disease; Peripheral Venous  Disease; Phlebitis; Vasculitis Integumentary (Skin) Complaints and Symptoms: Positive for: Wounds Negative for: Bleeding or bruising tendency; Breakdown; Swelling Medical History: Negative for: History of Burn; History of pressure wounds Lowery, Aryam N. ([id number]) Constitutional Symptoms (General Health) Complaints and Symptoms: No Complaints or Symptoms Eyes Complaints and Symptoms: No Complaints or Symptoms Medical History: Negative for: Cataracts; Glaucoma; Optic Neuritis Ear/Nose/Mouth/Throat Complaints and Symptoms: No Complaints or Symptoms Medical History: Negative for: Chronic sinus problems/congestion; Middle ear problems Hematologic/Lymphatic Complaints and Symptoms: No Complaints or Symptoms Medical History: Negative for: Anemia; Hemophilia; Human Immunodeficiency Virus; Lymphedema Respiratory Complaints and Symptoms: No Complaints  or Symptoms Medical History: Positive for: Asthma Negative for: Aspiration; Chronic Obstructive Pulmonary Disease (COPD); Pneumothorax; Sleep Apnea; Tuberculosis Gastrointestinal Complaints and Symptoms: No Complaints or Symptoms Medical History: Negative for: Cirrhosis ; Colitis; Crohnos; Hepatitis A; Hepatitis B; Hepatitis C Endocrine Complaints and Symptoms: No Complaints or Symptoms Medical History: BEENA, CATANO ([id number]) Negative for: Type I Diabetes; Type II Diabetes Genitourinary Complaints and Symptoms: No Complaints or Symptoms Medical History: Negative for: End Stage Renal Disease Immunological Complaints and Symptoms: No Complaints or Symptoms Medical History: Negative for: Lupus Erythematosus; Raynaudos; Scleroderma Musculoskeletal Complaints and Symptoms: No Complaints or Symptoms Medical History: Negative for: Gout; Rheumatoid Arthritis; Osteoarthritis; Osteomyelitis Neurologic Complaints and Symptoms: No Complaints or Symptoms Medical History: Negative for: Dementia; Neuropathy;  Quadriplegia; Paraplegia; Seizure Disorder Oncologic Complaints and Symptoms: No Complaints or Symptoms Medical History: Negative for: Received Chemotherapy; Received Radiation Psychiatric Complaints and Symptoms: No Complaints or Symptoms Medical History: Negative for: Anorexia/bulimia; Confinement Anxiety Immunizations DEIJAH, SPIKES ([id number]) Tetanus Vaccine: Last tetanus shot: 04/24/2015 Family and Social History Cancer: No; Diabetes: No; Heart Disease: No; Hereditary Spherocytosis: No; Hypertension: No; Kidney Disease: No; Lung Disease: No; Seizures: No; Stroke: No; Thyroid Problems: No; Tuberculosis: No; Current every day smoker - 4-5 daily; Marital Status - Single; Alcohol Use: Rarely; Drug Use: No History; Caffeine Use: Daily; Advanced Directives: No; Patient does not want information on Advanced Directives; Living Will: No Physician Affirmation I have reviewed and agree with the above information. Electronic Signature(s) Signed: 05/16/2015 11:35:42 AM By: Evlyn Kanner MD, FACS Signed: 05/16/2015 5:29:39 PM By: Elliot Gurney RN, BSN, Kim RN, BSN Entered By: Evlyn Kanner on 05/16/2015 11:35:39 Sindoni, Devoria Glassing ([id number]) -------------------------------------------------------------------------------- SuperBill Details Patient Name: Orlan Leavens. Date of Service: 05/16/2015 Medical Record Number: [id number] Patient Account Number: [id number] Date of Birth/Sex: [birth year] (25 y.o. Female) Treating RN: Huel Coventry Primary Care Physician: PATIENT, NO Other Clinician: Referring Physician: Jene Every Treating Physician/Extender: Rudene Re in Treatment: 0 Diagnosis Coding ICD-10 Codes Code Description [phone] Laceration without foreign body, right lower leg, initial encounter F17.218 Nicotine dependence, cigarettes, with other nicotine-induced disorders Facility Procedures CPT4 Code Description: [id number] 11042 - DEB SUBQ TISSUE 20 SQ CM/< ICD-10  Description Diagnosis S81.811A Laceration without foreign body, right lower leg, initi F17.218 Nicotine dependence, cigarettes, with other nicotine-in Modifier: al encounte duced disor Quantity: 1 r ders CPT4 Code Description: [id number] 99406-SMOKING CESSATION 3-10MINS ICD-10 Description Diagnosis S81.811A Laceration without foreign body, right lower leg, initi F17.218 Nicotine dependence, cigarettes, with other nicotine-in Modifier: al encounte duced disor Quantity: 1 r ders Physician Procedures CPT4 Code Description: [id number] 18841 - WC PHYS LEVEL 4 - NEW PT ICD-10 Description Diagnosis S81.811A Laceration without foreign body, right lower leg, initi F17.218 Nicotine dependence, cigarettes, with other nicotine-in Modifier: al encounte duced disor Quantity: 1 r ders CPT4 Code Description: [id number] 11042 - WC PHYS SUBQ TISS 20 SQ CM ICD-10 Description Diagnosis S81.811A Laceration without foreign body, right lower leg, initi F17.218 Nicotine dependence, cigarettes, with other nicotine-in Modifier: al encounte duced disor Quantity: 1 r ders CPT4 Code Description: [id number] 99406- SMOKING CESSATION 3-10 MINS Dressel, Gailyn N. ([id number]) Modifier: Quantity: 1 Electronic Signature(s) Signed: 05/16/2015 11:51:25 AM By: Evlyn Kanner MD, FACS Entered By: Evlyn Kanner on 05/16/2015 11:51:24

## 2015-05-17 NOTE — Progress Notes (Signed)
Tina Ibarra, Tina Ibarra ([id number]) Visit Report for 05/16/2015 Abuse/Suicide Risk Screen Details Patient Name: Tina Ibarra, Tina Ibarra 05/16/2015 10:45 Date of Service: AM Medical Record [id number] Number: Patient Account Number: [id number] Apr 05, 1990 (25 y.o. Treating RN: Huel Coventry Date of Birth/Sex: Female) Other Clinician: Primary Care Physician: PATIENT, NO Treating Britto, Errol Referring Physician: Jene Every Physician/Extender: Weeks in Treatment: 0 Abuse/Suicide Risk Screen Items Answer ABUSE/SUICIDE RISK SCREEN: Has anyone close to you tried to hurt or harm you recentlyo No Do you feel uncomfortable with anyone in your familyo No Has anyone forced you do things that you didnot want to doo No Do you have any thoughts of harming yourselfo No Patient displays signs or symptoms of abuse and/or neglect. No Electronic Signature(s) Signed: 05/16/2015 5:29:39 PM By: Elliot Gurney, RN, BSN, Kim RN, BSN Entered By: Elliot Gurney, RN, BSN, Kim on 05/16/2015 11:11:58 Tina Leavens ([id number]) -------------------------------------------------------------------------------- Activities of Daily Living Details Patient Name: Tina Ibarra, Tina Ibarra 05/16/2015 10:45 Date of Service: AM Medical Record [id number] Number: Patient Account Number: [id number] [birth year] (25 y.o. Treating RN: Huel Coventry Date of Birth/Sex: Female) Other Clinician: Primary Care Physician: PATIENT, NO Treating Britto, Errol Referring Physician: Jene Every Physician/Extender: Weeks in Treatment: 0 Activities of Daily Living Items Answer Activities of Daily Living (Please select one for each item) Drive Automobile Completely Able Take Medications Completely Able Use Telephone Completely Able Care for Appearance Completely Able Use Toilet Completely Able Bath / Shower Completely Able Dress Self Completely Able Feed Self Completely Able Walk Completely Able Get In / Out Bed Completely Able Housework Completely  Able Prepare Meals Completely Able Handle Money Completely Able Shop for Self Completely Able Electronic Signature(s) Signed: 05/16/2015 5:29:39 PM By: Elliot Gurney, RN, BSN, Kim RN, BSN Entered By: Elliot Gurney, RN, BSN, Kim on 05/16/2015 11:12:07 ZARAHI, FUERST ([id number]) -------------------------------------------------------------------------------- Education Assessment Details Patient Name: Tina Ibarra, Tina Ibarra 05/16/2015 10:45 Date of Service: AM Medical Record [id number] Number: Patient Account Number: [id number] 1990-12-06 (25 y.o. Treating RN: Huel Coventry Date of Birth/Sex: Female) Other Clinician: Primary Care Physician: PATIENT, NO Treating Britto, Errol Referring Physician: Jene Every Physician/Extender: Tania Ade in Treatment: 0 Primary Learner Assessed: Patient Learning Preferences/Education Level/Primary Language Learning Preference: Explanation Highest Education Level: High School Preferred Language: English Cognitive Barrier Assessment/Beliefs Language Barrier: No Translator Needed: No Memory Deficit: No Emotional Barrier: No Cultural/Religious Beliefs Affecting Medical No Care: Physical Barrier Assessment Impaired Vision: No Impaired Hearing: No Decreased Hand dexterity: No Knowledge/Comprehension Assessment Knowledge Level: High Comprehension Level: High Ability to understand written High instructions: Ability to understand verbal High instructions: Motivation Assessment Anxiety Level: Calm Cooperation: Cooperative Education Importance: Acknowledges Need Interest in Health Problems: Asks Questions Perception: Coherent Willingness to Engage in Self- High Management Activities: Readiness to Engage in Self- High Management Activities: Tina Ibarra, Tina Ibarra ([id number]) Electronic Signature(s) Signed: 05/16/2015 5:29:39 PM By: Elliot Gurney, RN, BSN, Kim RN, BSN Entered By: Elliot Gurney, RN, BSN, Kim on 05/16/2015 11:12:40 Tina Ibarra, Tina Ibarra  ([id number]) -------------------------------------------------------------------------------- Fall Risk Assessment Details Patient Name: Tina Ibarra, Tina Ibarra 05/16/2015 10:45 Date of Service: AM Medical Record [id number] Number: Patient Account Number: [id number] 02/16/1990 (25 y.o. Treating RN: Huel Coventry Date of Birth/Sex: Female) Other Clinician: Primary Care Physician: PATIENT, NO Treating Britto, Errol Referring Physician: Jene Every Physician/Extender: Weeks in Treatment: 0 Fall Risk Assessment Items Have you had 2 or more falls in the last 12 monthso 0 No Have you had any fall that resulted in injury in the last 12 monthso 0 No FALL RISK ASSESSMENT: History of falling - immediate or within 3 months  0 No Secondary diagnosis 0 No Ambulatory aid None/bed rest/wheelchair/nurse 0 Yes Crutches/cane/walker 0 No Furniture 0 No IV Access/Saline Lock 0 No Gait/Training Normal/bed rest/immobile 0 Yes Weak 0 No Impaired 0 No Mental Status Oriented to own ability 0 Yes Electronic Signature(s) Signed: 05/16/2015 5:29:39 PM By: Elliot GurneyWoody, RN, BSN, Kim RN, BSN Entered By: Elliot GurneyWoody, RN, BSN, Kim on 05/16/2015 11:13:05 Tina Ibarra, Tina N. ([id number]) -------------------------------------------------------------------------------- Foot Assessment Details Patient Name: Tina Ibarra, Tina N. 05/16/2015 10:45 Date of Service: AM Medical Record [id number] Number: Patient Account Number: [id number] 10-Aug-1990 (25 y.o. Treating RN: Huel CoventryWoody, Kim Date of Birth/Sex: Female) Other Clinician: Primary Care Physician: PATIENT, NO Treating Britto, Errol Referring Physician: Jene EveryKINNER, ROBERT Physician/Extender: Weeks in Treatment: 0 Foot Assessment Items Site Locations + = Sensation present, - = Sensation absent, C = Callus, U = Ulcer R = Redness, W = Warmth, M = Maceration, PU = Pre-ulcerative lesion F = Fissure, S = Swelling, D = Dryness Assessment Right: Left: Other Deformity: No No Prior Foot  Ulcer: No No Prior Amputation: No No Charcot Joint: No No Ambulatory Status: Ambulatory Without Help Gait: Steady Electronic Signature(s) Signed: 05/16/2015 5:29:39 PM By: Elliot GurneyWoody, RN, BSN, Kim RN, BSN Entered By: Elliot GurneyWoody, RN, BSN, Kim on 05/16/2015 11:13:57 Tina Ibarra, Tina GlassingJASMINE N. ([id number]) -------------------------------------------------------------------------------- Nutrition Risk Assessment Details Patient Name: Tina Ibarra, Monifah N. 05/16/2015 10:45 Date of Service: AM Medical Record [id number] Number: Patient Account Number: [id number] 10-Aug-1990 (25 y.o. Treating RN: Huel CoventryWoody, Kim Date of Birth/Sex: Female) Other Clinician: Primary Care Physician: PATIENT, NO Treating Britto, Errol Referring Physician: Jene EveryKINNER, ROBERT Physician/Extender: Weeks in Treatment: 0 Height (in): 65 Weight (lbs): 231 Body Mass Index (BMI): 38.4 Nutrition Risk Assessment Items NUTRITION RISK SCREEN: I have an illness or condition that made me change the kind and/or 0 No amount of food I eat I eat fewer than two meals per day 0 No I eat few fruits and vegetables, or milk products 0 No I have three or more drinks of beer, liquor or wine almost every day 0 No I have tooth or mouth problems that make it hard for me to eat 0 No I don't always have enough money to buy the food I need 0 No I eat alone most of the time 0 No I take three or more different prescribed or over-the-counter drugs a 0 No day Without wanting to, I have lost or gained 10 pounds in the last six 0 No months I am not always physically able to shop, cook and/or feed myself 0 No Nutrition Protocols Good Risk Protocol 0 No interventions needed Moderate Risk Protocol Electronic Signature(s) Signed: 05/16/2015 5:29:39 PM By: Elliot GurneyWoody, RN, BSN, Kim RN, BSN Entered By: Elliot GurneyWoody, RN, BSN, Kim on 05/16/2015 11:13:40

## 2015-05-17 NOTE — Progress Notes (Signed)
SHANNELL, MIKKELSEN ([id number]) Visit Report for 05/16/2015 Allergy List Details Patient Name: MARIMAR, SUBER 05/16/2015 10:45 Date of Service: AM Medical Record [id number] Number: Patient Account Number: [id number] 1990/07/06 (25 y.o. Treating RN: Huel Coventry Date of Birth/Sex: Female) Other Clinician: Primary Care Physician: PATIENT, NO Treating Britto, Errol Referring Physician: Jene Every Physician/Extender: Weeks in Treatment: 0 Allergies Active Allergies mushrooms bee sting Allergy Notes Electronic Signature(s) Signed: 05/16/2015 5:29:39 PM By: Elliot Gurney, RN, BSN, Kim RN, BSN Entered By: Elliot Gurney, RN, BSN, Kim on 05/16/2015 11:07:00 SEHAJ, KOLDEN ([id number]) -------------------------------------------------------------------------------- Arrival Information Details Patient Name: LILIAH, DORIAN 05/16/2015 10:45 Date of Service: AM Medical Record [id number] Number: Patient Account Number: [id number] 29-Jun-1990 (25 y.o. Treating RN: Huel Coventry Date of Birth/Sex: Female) Other Clinician: Primary Care Physician: PATIENT, NO Treating Britto, Errol Referring Physician: Jene Every Physician/Extender: Tania Ade in Treatment: 0 Visit Information Patient Arrived: Ambulatory Arrival Time: 10:46 Accompanied By: self Transfer Assistance: None Patient Identification Verified: Yes Secondary Verification Process Yes Completed: Electronic Signature(s) Signed: 05/16/2015 5:29:39 PM By: Elliot Gurney, RN, BSN, Kim RN, BSN Entered By: Elliot Gurney, RN, BSN, Kim on 05/16/2015 10:46:27 Orlan Leavens ([id number]) -------------------------------------------------------------------------------- Encounter Discharge Information Details Patient Name: AMIA, RYNDERS 05/16/2015 10:45 Date of Service: AM Medical Record [id number] Number: Patient Account Number: [id number] December 30, 1990 (25 y.o. Treating RN: Huel Coventry Date of Birth/Sex: Female) Other Clinician: Primary Care Physician:  PATIENT, NO Treating Britto, Errol Referring Physician: Jene Every Physician/Extender: Weeks in Treatment: 0 Encounter Discharge Information Items Schedule Follow-up Appointment: No Medication Reconciliation completed and provided to Patient/Care No Hendy Brindle: Provided on Clinical Summary of Care: 05/16/2015 Form Type Recipient Paper Patient JB Electronic Signature(s) Signed: 05/16/2015 11:55:37 AM By: Gwenlyn Perking Entered By: Gwenlyn Perking on 05/16/2015 11:55:37 Paone, Devoria Glassing ([id number]) -------------------------------------------------------------------------------- Lower Extremity Assessment Details Patient Name: TYLAR, MERENDINO 05/16/2015 10:45 Date of Service: AM Medical Record [id number] Number: Patient Account Number: [id number] 03/04/1990 (25 y.o. Treating RN: Huel Coventry Date of Birth/Sex: Female) Other Clinician: Primary Care Physician: PATIENT, NO Treating Britto, Errol Referring Physician: Jene Every Physician/Extender: Weeks in Treatment: 0 Edema Assessment Assessed: [Left: No] [Right: No] E[Left: dema] [Right: :] Calf Left: Right: Point of Measurement: 32 cm From Medial Instep cm 43.5 cm Ankle Left: Right: Point of Measurement: 10 cm From Medial Instep cm 22 cm Vascular Assessment Pulses: Posterior Tibial Palpable: [Right:Yes] Doppler: [Right:Multiphasic] Dorsalis Pedis Palpable: [Right:Yes] Doppler: [Right:Multiphasic] Extremity colors, hair growth, and conditions: Extremity Color: [Right:Normal] Hair Growth on Extremity: [Right:Yes] Temperature of Extremity: [Right:Cool] Capillary Refill: [Right:> 3 seconds] Blood Pressure: Brachial: [Right:100] Dorsalis Pedis: [Left:Dorsalis Pedis: 118] Ankle: Posterior Tibial: [Left:Posterior Tibial: 120] [Right:1.20] Toe Nail Assessment Left: Right: Thick: No Discolored: No Giron, Matasha N. ([id number]) Deformed: No Improper Length and Hygiene: No Electronic Signature(s) Signed:  05/16/2015 5:29:39 PM By: Elliot Gurney, RN, BSN, Kim RN, BSN Entered By: Elliot Gurney, RN, BSN, Kim on 05/16/2015 10:58:54 Orlan Leavens ([id number]) -------------------------------------------------------------------------------- Multi-Disciplinary Care Plan Details Patient Name: CABELLA, KIMM 05/16/2015 10:45 Date of Service: AM Medical Record [id number] Number: Patient Account Number: [id number] [birth year] (25 y.o. Treating RN: Huel Coventry Date of Birth/Sex: Female) Other Clinician: Primary Care Physician: PATIENT, NO Treating Britto, Errol Referring Physician: Jene Every Physician/Extender: Tania Ade in Treatment: 0 Active Inactive Abuse / Safety / Falls / Self Care Management Nursing Diagnoses: Knowledge deficit related to: safety; personal, health (wound), emergency Goals: Patient/caregiver will verbalize/demonstrate measures taken to improve the patient's personal safety Date Initiated: 05/16/2015 Goal Status: Active Interventions: Assess self care needs on admission and as needed Notes:  Orientation to the Wound Care Program Nursing Diagnoses: Knowledge deficit related to the wound healing center program Goals: Patient/caregiver will verbalize understanding of the Wound Healing Center Program Date Initiated: 05/16/2015 Goal Status: Active Interventions: Provide education on orientation to the wound center Notes: Wound/Skin Impairment Nursing Diagnoses: Knowledge deficit related to smoking impact on wound healing Goals: ARMINTA, GAMM ([id number]) Patient/caregiver will verbalize understanding of skin care regimen Date Initiated: 05/16/2015 Goal Status: Active Interventions: Assess ulceration(s) every visit Notes: Electronic Signature(s) Signed: 05/16/2015 5:29:39 PM By: Elliot Gurney, RN, BSN, Kim RN, BSN Entered By: Elliot Gurney, RN, BSN, Kim on 05/16/2015 11:15:27 Orlan Leavens  ([id number]) -------------------------------------------------------------------------------- Pain Assessment Details Patient Name: RENISHA, COCKRUM 05/16/2015 10:45 Date of Service: AM Medical Record [id number] Number: Patient Account Number: [id number] 1990/03/29 (25 y.o. Treating RN: Huel Coventry Date of Birth/Sex: Female) Other Clinician: Primary Care Physician: PATIENT, NO Treating Britto, Errol Referring Physician: Jene Every Physician/Extender: Weeks in Treatment: 0 Active Problems Location of Pain Severity and Description of Pain Patient Has Paino No Site Locations Pain Management and Medication Current Pain Management: Electronic Signature(s) Signed: 05/16/2015 5:29:39 PM By: Elliot Gurney, RN, BSN, Kim RN, BSN Entered By: Elliot Gurney, RN, BSN, Kim on 05/16/2015 10:47:00 Orlan Leavens ([id number]) -------------------------------------------------------------------------------- Wound Assessment Details Patient Name: KORTNEE, BAS 05/16/2015 10:45 Date of Service: AM Medical Record [id number] Number: Patient Account Number: [id number] 12/01/1990 (24 y.o. Treating RN: Huel Coventry Date of Birth/Sex: Female) Other Clinician: Primary Care Physician: PATIENT, NO Treating Britto, Errol Referring Physician: Jene Every Physician/Extender: Weeks in Treatment: 0 Wound Status Wound Number: 1 Primary Etiology: Trauma, Other Wound Location: Right Lower Leg - Medial, Wound Status: Open Anterior, Proximal Wounding Event: Trauma Date Acquired: 04/24/2015 Weeks Of Treatment: 0 Clustered Wound: No Photos Photo Uploaded By: Elliot Gurney, RN, BSN, Kim on 05/16/2015 11:27:12 Wound Measurements Length: (cm) 2.5 Width: (cm) 4 Depth: (cm) 1.1 Area: (cm) 7.854 Volume: (cm) 8.639 % Reduction in Area: 0% % Reduction in Volume: 0% Epithelialization: None Wound Description Full Thickness Without Exposed Classification: Support Structures Wound Margin:  Epibole Exudate Medium Amount: Exudate Type: Serosanguineous Exudate Color: red, brown Wound Bed Miceli, Gwendlyon N. ([id number]) Granulation Amount: None Present (0%) Exposed Structure Necrotic Amount: Large (67-100%) Fascia Exposed: No Necrotic Quality: Eschar Fat Layer Exposed: No Tendon Exposed: No Muscle Exposed: No Joint Exposed: No Bone Exposed: No Limited to Skin Breakdown Periwound Skin Texture Texture Color No Abnormalities Noted: No No Abnormalities Noted: No Callus: No Atrophie Blanche: No Crepitus: No Cyanosis: No Excoriation: No Ecchymosis: No Fluctuance: No Erythema: No Friable: No Hemosiderin Staining: Yes Induration: No Mottled: No Localized Edema: No Pallor: No Rash: No Rubor: No Scarring: No Moisture No Abnormalities Noted: No Dry / Scaly: No Maceration: No Moist: No Wound Preparation Ulcer Cleansing: Rinsed/Irrigated with Saline Topical Anesthetic Applied: Other: lidocaine 4%, Electronic Signature(s) Signed: 05/16/2015 5:29:39 PM By: Elliot Gurney, RN, BSN, Kim RN, BSN Entered By: Elliot Gurney, RN, BSN, Kim on 05/16/2015 11:04:49 Orlan Leavens ([id number]) -------------------------------------------------------------------------------- Wound Assessment Details Patient Name: JERMISHA, HOFFART 05/16/2015 10:45 Date of Service: AM Medical Record [id number] Number: Patient Account Number: [id number] 07-10-1990 (24 y.o. Treating RN: Huel Coventry Date of Birth/Sex: Female) Other Clinician: Primary Care Physician: PATIENT, NO Treating Britto, Errol Referring Physician: Jene Every Physician/Extender: Weeks in Treatment: 0 Wound Status Wound Number: 2 Primary Etiology: Trauma, Other Wound Location: Right, Distal, Medial, Anterior Wound Status: Open Lower Leg Wounding Event: Trauma Date Acquired: 04/24/2015 Weeks Of Treatment: 0 Clustered Wound: No Photos Photo Uploaded By: Elliot Gurney, RN, BSN, Kim  on 05/16/2015 11:27:13 Wound  Measurements Length: (cm) 0.1 Width: (cm) 0.1 Depth: (cm) 0.1 Area: (cm) 0.008 Volume: (cm) 0.001 % Reduction in Area: 99.1% % Reduction in Volume: 98.8% Wound Description Classification: Unclassifiable Wound Margin: Indistinct, nonvisible Exudate Amount: None Present Wound Bed Granulation Amount: None Present (0%) Exposed Structure Necrotic Amount: Large (67-100%) Fascia Exposed: No Necrotic Quality: Eschar Fat Layer Exposed: No Tendon Exposed: No Fridman, Clodagh N. ([id number]) Muscle Exposed: No Joint Exposed: No Bone Exposed: No Limited to Skin Breakdown Periwound Skin Texture Texture Color No Abnormalities Noted: No No Abnormalities Noted: No Callus: No Atrophie Blanche: No Crepitus: No Cyanosis: No Excoriation: No Ecchymosis: No Fluctuance: No Erythema: No Friable: No Hemosiderin Staining: No Induration: No Mottled: No Localized Edema: No Pallor: No Rash: No Rubor: No Scarring: No Moisture No Abnormalities Noted: No Dry / Scaly: Yes Maceration: No Moist: No Wound Preparation Ulcer Cleansing: Rinsed/Irrigated with Saline Topical Anesthetic Applied: Other: lidocaine4%, Electronic Signature(s) Signed: 05/16/2015 5:29:39 PM By: Elliot GurneyWoody, RN, BSN, Kim RN, BSN Entered By: Elliot GurneyWoody, RN, BSN, Kim on 05/16/2015 11:44:16 Orlan LeavensBURTON, Elliet N. ([id number]) -------------------------------------------------------------------------------- Vitals Details Patient Name: Orlan LeavensBURTON, Jeri N. 05/16/2015 10:45 Date of Service: AM Medical Record [id number] Number: Patient Account Number: [id number] 06/30/1990 (24 y.o. Treating RN: Huel CoventryWoody, Kim Date of Birth/Sex: Female) Other Clinician: Primary Care Physician: PATIENT, NO Treating Britto, Errol Referring Physician: Jene EveryKINNER, ROBERT Physician/Extender: Weeks in Treatment: 0 Vital Signs Time Taken: 10:47 Temperature (F): 98.1 Height (in): 65 Pulse (bpm): 70 Weight (lbs): 231 Respiratory Rate (breaths/min): 18 Body  Mass Index (BMI): 38.4 Blood Pressure (mmHg): 115/64 Reference Range: 80 - 120 mg / dl Electronic Signature(s) Signed: 05/16/2015 5:29:39 PM By: Elliot GurneyWoody, RN, BSN, Kim RN, BSN Entered By: Elliot GurneyWoody, RN, BSN, Kim on 05/16/2015 10:49:56

## 2015-05-23 ENCOUNTER — Encounter: Payer: Self-pay | Admitting: Surgery

## 2015-05-24 NOTE — Progress Notes (Addendum)
Tina Ibarra ([id number]) Visit Report for 05/23/2015 Chief Complaint Document Details Patient Name: Tina Ibarra, Tina Ibarra 05/23/2015 12:45 Date of Service: PM Medical Record [id number] Number: Patient Account Number: [id number] Dec 02, 1990 (24 y.o. Treating RN: Huel Coventry Date of Birth/Sex: Female) Other Clinician: Primary Care Physician: PATIENT, NO Treating Sohum Delillo Referring Physician: Jene Every Physician/Extender: Weeks in Treatment: 1 Information Obtained from: Patient Chief Complaint Patient seen for complaints of Non-Healing Wound to the right lower extremity which she's had for about 3 weeks Electronic Signature(s) Signed: 05/23/2015 1:16:28 PM By: Evlyn Kanner MD, FACS Entered By: Evlyn Kanner on 05/23/2015 13:16:27 Tina Ibarra ([id number]) -------------------------------------------------------------------------------- HPI Details Patient Name: Tina Ibarra, Tina Ibarra 05/23/2015 12:45 Date of Service: PM Medical Record [id number] Number: Patient Account Number: [id number] 06/24/1990 (24 y.o. Treating RN: Huel Coventry Date of Birth/Sex: Female) Other Clinician: Primary Care Physician: PATIENT, NO Treating Corrina Steffensen Referring Physician: Jene Every Physician/Extender: Weeks in Treatment: 1 History of Present Illness Location: lacerated wound to the right lower extremity Quality: Patient reports experiencing a sharp pain to affected area(s). Severity: Patient states wound are getting worse. Duration: Patient has had the wound for < 3 weeks prior to presenting for treatment Timing: Pain in wound is Intermittent (comes and goes Context: The wound occurred when the patient he dropped a heavy object on her right leg while she was working on a car Modifying Factors: Other treatment(s) tried include:was applying local care but the wound got infected and she was in the ER yesterday and put on Bactrim for 10 days Associated Signs and Symptoms: Patient  reports having foul odor. HPI Description: in detail patient was seen in the ER yesterday after 3 weeks of having had a laceration to her leg which was nonsuturable secondary to the width. The patient noticed a foul odor coming from the wound. was seen in the ER put on Bactrim DS for 10 days and referred to the wound clinic again. her original injury was on 04/24/2015 when she was working on her car and the brake rotor fell on her right leg and caused a laceration. she is a smoker and smokes cigarettes daily. x-ray of the leg showed no fracture or radiopaque foreign bodies. At that stage the wound could not be sutured because of the avulsion of the skin and a dressing was placed and was given a tetanus toxoid and discharged. Electronic Signature(s) Signed: 05/23/2015 1:16:33 PM By: Evlyn Kanner MD, FACS Entered By: Evlyn Kanner on 05/23/2015 13:16:33 Tina Ibarra ([id number]) -------------------------------------------------------------------------------- Physical Exam Details Patient Name: Tina Ibarra, Tina Ibarra 05/23/2015 12:45 Date of Service: PM Medical Record [id number] Number: Patient Account Number: [id number] [birth year] (24 y.o. Treating RN: Huel Coventry Date of Birth/Sex: Female) Other Clinician: Primary Care Physician: PATIENT, NO Treating Adrian Specht Referring Physician: Jene Every Physician/Extender: Weeks in Treatment: 1 Constitutional . Pulse regular. Respirations normal and unlabored. Afebrile. . Eyes Nonicteric. Reactive to light. Ears, Nose, Mouth, and Throat Lips, teeth, and gums WNL.Marland Kitchen Moist mucosa without lesions. Neck supple and nontender. No palpable supraclavicular or cervical adenopathy. Normal sized without goiter. Respiratory WNL. No retractions.. Cardiovascular Pedal Pulses WNL. No clubbing, cyanosis or edema. Lymphatic No adneopathy. No adenopathy. No adenopathy. Musculoskeletal Adexa without tenderness or enlargement.. Digits and nails w/o  clubbing, cyanosis, infection, petechiae, ischemia, or inflammatory conditions.. Integumentary (Hair, Skin) No suspicious lesions. No crepitus or fluctuance. No peri-wound warmth or erythema. No masses.Marland Kitchen Psychiatric Judgement and insight Intact.. No evidence of depression, anxiety, or agitation.. Notes the wound looks very much better with  minimal callus around it and no odor and there is healthy granulation tissue. Electronic Signature(s) Signed: 05/23/2015 1:17:22 PM By: Evlyn Kanner MD, FACS Entered By: Evlyn Kanner on 05/23/2015 13:17:22 Tina Ibarra, Tina Ibarra ([id number]) -------------------------------------------------------------------------------- Physician Orders Details Patient Name: Tina Ibarra, Tina Ibarra 05/23/2015 12:45 Date of Service: PM Medical Record [id number] Number: Patient Account Number: [id number] 09/13/1990 (24 y.o. Treating RN: Huel Coventry Date of Birth/Sex: Female) Other Clinician: Primary Care Physician: PATIENT, NO Treating Vennessa Affinito Referring Physician: Jene Every Physician/Extender: Tania Ade in Treatment: 1 Verbal / Phone Orders: Yes Clinician: Huel Coventry Read Back and Verified: Yes Diagnosis Coding Wound Cleansing Wound #1 Right,Proximal,Medial,Anterior Lower Leg o Clean wound with Normal Saline. Wound #2 Right,Distal,Medial,Anterior Lower Leg o Clean wound with Normal Saline. Anesthetic Wound #1 Right,Proximal,Medial,Anterior Lower Leg o Topical Lidocaine 4% cream applied to wound bed prior to debridement Wound #2 Right,Distal,Medial,Anterior Lower Leg o Topical Lidocaine 4% cream applied to wound bed prior to debridement Primary Wound Dressing Wound #1 Right,Proximal,Medial,Anterior Lower Leg o Medihoney gel Secondary Dressing Wound #1 Right,Proximal,Medial,Anterior Lower Leg o Boardered Foam Dressing Wound #2 Right,Distal,Medial,Anterior Lower Leg o Boardered Foam Dressing Dressing Change Frequency Wound #1  Right,Proximal,Medial,Anterior Lower Leg o Change dressing every day. Wound #2 Right,Distal,Medial,Anterior Lower Leg o Change dressing every day. Follow-up Appointments Wound #1 Right,Proximal,Medial,Anterior Lower Leg Santosuosso, Mirta N. ([id number]) o Return Appointment in 1 week. Wound #2 Right,Distal,Medial,Anterior Lower Leg o Return Appointment in 1 week. Edema Control Wound #1 Right,Proximal,Medial,Anterior Lower Leg o Elevate legs to the level of the heart and pump ankles as often as possible Wound #2 Right,Distal,Medial,Anterior Lower Leg o Elevate legs to the level of the heart and pump ankles as often as possible Additional Orders / Instructions Wound #1 Right,Proximal,Medial,Anterior Lower Leg o Stop Smoking o Increase protein intake. o Activity as tolerated Wound #2 Right,Distal,Medial,Anterior Lower Leg o Stop Smoking o Increase protein intake. o Activity as tolerated Medications-please add to medication list. Wound #1 Right,Proximal,Medial,Anterior Lower Leg o P.O. Antibiotics - Continue antibiotics o Other: - Solicitor) Signed: 05/23/2015 3:46:07 PM By: Evlyn Kanner MD, FACS Signed: 05/23/2015 5:30:12 PM By: Elliot Gurney RN, BSN, Kim RN, BSN Entered By: Elliot Gurney, RN, BSN, Kim on 05/23/2015 13:19:05 Tina Ibarra, Tina Ibarra ([id number]) -------------------------------------------------------------------------------- Problem List Details Patient Name: Tina Ibarra, Tina Ibarra 05/23/2015 12:45 Date of Service: PM Medical Record [id number] Number: Patient Account Number: [id number] 07/02/1990 (24 y.o. Treating RN: Huel Coventry Date of Birth/Sex: Female) Other Clinician: Primary Care Physician: PATIENT, NO Treating Jaelie Aguilera Referring Physician: Jene Every Physician/Extender: Weeks in Treatment: 1 Active Problems ICD-10 Encounter Code Description Active Date Diagnosis S81.811A Laceration without foreign body, right lower  leg, initial 05/16/2015 Yes encounter F17.218 Nicotine dependence, cigarettes, with other nicotine- 05/16/2015 Yes induced disorders Inactive Problems Resolved Problems Electronic Signature(s) Signed: 05/23/2015 1:16:20 PM By: Evlyn Kanner MD, FACS Entered By: Evlyn Kanner on 05/23/2015 13:16:20 Tina Ibarra ([id number]) -------------------------------------------------------------------------------- Progress Note Details Patient Name: Tina Ibarra 05/23/2015 12:45 Date of Service: PM Medical Record [id number] Number: Patient Account Number: [id number] 10/21/1990 (24 y.o. Treating RN: Huel Coventry Date of Birth/Sex: Female) Other Clinician: Primary Care Physician: PATIENT, NO Treating Nephtali Docken Referring Physician: Jene Every Physician/Extender: Weeks in Treatment: 1 Subjective Chief Complaint Information obtained from Patient Patient seen for complaints of Non-Healing Wound to the right lower extremity which she's had for about 3 weeks History of Present Illness (HPI) The following HPI elements were documented for the patient's wound: Location: lacerated wound to the right lower extremity Quality: Patient reports experiencing a sharp  pain to affected area(s). Severity: Patient states wound are getting worse. Duration: Patient has had the wound for < 3 weeks prior to presenting for treatment Timing: Pain in wound is Intermittent (comes and goes Context: The wound occurred when the patient he dropped a heavy object on her right leg while she was working on a car Modifying Factors: Other treatment(s) tried include:was applying local care but the wound got infected and she was in the ER yesterday and put on Bactrim for 10 days Associated Signs and Symptoms: Patient reports having foul odor. in detail patient was seen in the ER yesterday after 3 weeks of having had a laceration to her leg which was nonsuturable secondary to the width. The patient noticed a foul  odor coming from the wound. was seen in the ER put on Bactrim DS for 10 days and referred to the wound clinic again. her original injury was on 04/24/2015 when she was working on her car and the brake rotor fell on her right leg and caused a laceration. she is a smoker and smokes cigarettes daily. x-ray of the leg showed no fracture or radiopaque foreign bodies. At that stage the wound could not be sutured because of the avulsion of the skin and a dressing was placed and was given a tetanus toxoid and discharged. Objective Constitutional Pulse regular. Respirations normal and unlabored. Afebrile. Tina Ibarra, Tina Ibarra ([id number]) Vitals Time Taken: 12:59 PM, Height: 65 in, Weight: 231 lbs, BMI: 38.4, Temperature: 98.3 F, Pulse: 92 bpm, Respiratory Rate: 16 breaths/min, Blood Pressure: 125/65 mmHg. Eyes Nonicteric. Reactive to light. Ears, Nose, Mouth, and Throat Lips, teeth, and gums WNL.Marland Kitchen Moist mucosa without lesions. Neck supple and nontender. No palpable supraclavicular or cervical adenopathy. Normal sized without goiter. Respiratory WNL. No retractions.. Cardiovascular Pedal Pulses WNL. No clubbing, cyanosis or edema. Lymphatic No adneopathy. No adenopathy. No adenopathy. Musculoskeletal Adexa without tenderness or enlargement.. Digits and nails w/o clubbing, cyanosis, infection, petechiae, ischemia, or inflammatory conditions.Marland Kitchen Psychiatric Judgement and insight Intact.. No evidence of depression, anxiety, or agitation.. General Notes: the wound looks very much better with minimal callus around it and no odor and there is healthy granulation tissue. Integumentary (Hair, Skin) No suspicious lesions. No crepitus or fluctuance. No peri-wound warmth or erythema. No masses.. Wound #1 status is Open. Original cause of wound was Trauma. The wound is located on the Right,Proximal,Medial,Anterior Lower Leg. The wound measures 1.9cm length x 2.5cm width x 0.9cm depth; 3.731cm^2 area and  3.358cm^3 volume. The wound is limited to skin breakdown. There is no tunneling or undermining noted. There is a medium amount of serosanguineous drainage noted. The wound margin is epibole. There is medium (34-66%) red granulation within the wound bed. There is a small (1-33%) amount of necrotic tissue within the wound bed including Adherent Slough. The periwound skin appearance exhibited: Scarring, Hemosiderin Staining. The periwound skin appearance did not exhibit: Callus, Crepitus, Excoriation, Fluctuance, Friable, Induration, Localized Edema, Rash, Dry/Scaly, Maceration, Moist, Atrophie Blanche, Cyanosis, Ecchymosis, Mottled, Pallor, Rubor, Erythema. Wound #2 status is Open. Original cause of wound was Trauma. The wound is located on the Right,Distal,Medial,Anterior Lower Leg. The wound measures 0.1cm length x 0.1cm width x 0.1cm depth; 0.008cm^2 area and 0.001cm^3 volume. Tina Ibarra, Tina Ibarra ([id number]) Assessment Active Problems ICD-10 [phone] - Laceration without foreign body, right lower leg, initial encounter F17.218 - Nicotine dependence, cigarettes, with other nicotine-induced disorders Plan Wound Cleansing: Wound #1 Right,Proximal,Medial,Anterior Lower Leg: Clean wound with Normal Saline. Wound #2 Right,Distal,Medial,Anterior Lower Leg: Clean wound with Normal  Saline. Anesthetic: Wound #1 Right,Proximal,Medial,Anterior Lower Leg: Topical Lidocaine 4% cream applied to wound bed prior to debridement Wound #2 Right,Distal,Medial,Anterior Lower Leg: Topical Lidocaine 4% cream applied to wound bed prior to debridement Primary Wound Dressing: Wound #1 Right,Proximal,Medial,Anterior Lower Leg: Medihoney gel Secondary Dressing: Wound #1 Right,Proximal,Medial,Anterior Lower Leg: Boardered Foam Dressing Wound #2 Right,Distal,Medial,Anterior Lower Leg: Boardered Foam Dressing Dressing Change Frequency: Wound #1 Right,Proximal,Medial,Anterior Lower Leg: Change dressing every  day. Wound #2 Right,Distal,Medial,Anterior Lower Leg: Change dressing every day. Follow-up Appointments: Wound #1 Right,Proximal,Medial,Anterior Lower Leg: Return Appointment in 1 week. Wound #2 Right,Distal,Medial,Anterior Lower Leg: Return Appointment in 1 week. Edema Control: Wound #1 Right,Proximal,Medial,Anterior Lower Leg: Elevate legs to the level of the heart and pump ankles as often as possible Wound #2 Right,Distal,Medial,Anterior Lower Leg: Elevate legs to the level of the heart and pump ankles as often as possible Tina Ibarra, Tina N. ([id number]) Additional Orders / Instructions: Wound #1 Right,Proximal,Medial,Anterior Lower Leg: Stop Smoking Increase protein intake. Activity as tolerated Wound #2 Right,Distal,Medial,Anterior Lower Leg: Stop Smoking Increase protein intake. Activity as tolerated Medications-please add to medication list.: Wound #1 Right,Proximal,Medial,Anterior Lower Leg: P.O. Antibiotics - Continue antibiotics Other: - Medihoney I have recommended: 1. Medihoney to be applied daily and a bordered foam over this. 2. May shower and wash with soap and water daily 3. Completely give up smoking and I have discussed this in great detail with her and spent over 3 minutes talking about this she says she'll be compliant 4. Regular visits to the wound center Electronic Signature(s) Signed: 05/26/2015 3:43:36 PM By: Evlyn KannerBritto, Mylan Lengyel MD, FACS Previous Signature: 05/26/2015 3:43:25 PM Version By: Evlyn KannerBritto, Courage Biglow MD, FACS Previous Signature: 05/23/2015 1:18:24 PM Version By: Evlyn KannerBritto, Jaylee Freeze MD, FACS Entered By: Evlyn KannerBritto, Zyheir Daft on 05/26/2015 15:43:36 Tina Ibarra, Shanielle Dorris CarnesN. ([id number]) -------------------------------------------------------------------------------- SuperBill Details Patient Name: Tina LeavensBURTON, Tina N. Date of Service: 05/23/2015 Medical Record Number: [id number] Patient Account Number: [id number] Date of Birth/Sex: 10-06-1990 (25 y.o. Female) Treating RN: Huel CoventryWoody,  Kim Primary Care Physician: PATIENT, NO Other Clinician: Referring Physician: Jene EveryKINNER, ROBERT Treating Physician/Extender: Rudene ReBritto, Leyla Soliz Weeks in Treatment: 1 Diagnosis Coding ICD-10 Codes Code Description [phone] Laceration without foreign body, right lower leg, initial encounter F17.218 Nicotine dependence, cigarettes, with other nicotine-induced disorders Facility Procedures CPT4 Code: [id number] Description: [id number] - WOUND CARE VISIT-LEV 2 EST PT Modifier: Quantity: 1 Physician Procedures CPT4 Code Description: [id number] 99213 - WC PHYS LEVEL 3 - EST PT ICD-10 Description Diagnosis S81.811A Laceration without foreign body, right lower leg, initi F17.218 Nicotine dependence, cigarettes, with other nicotine-in Modifier: al encounter duced disord Quantity: 1 ers Electronic Signature(s) Signed: 05/26/2015 3:05:43 PM By: Elliot GurneyWoody, RN, BSN, Kim RN, BSN Signed: 05/26/2015 3:39:56 PM By: Evlyn KannerBritto, Brayleigh Rybacki MD, FACS Previous Signature: 05/23/2015 1:18:35 PM Version By: Evlyn KannerBritto, Peg Fifer MD, FACS Entered By: Elliot GurneyWoody, RN, BSN, Kim on 05/26/2015 11:35:01

## 2015-05-24 NOTE — Progress Notes (Signed)
Tina Ibarra, Minta N. ([id number]) Visit Report for 05/23/2015 Arrival Information Details Patient Name: Tina Ibarra, Tina N. Date of Service: 05/23/2015 12:45 PM Medical Record Number: [id number] Patient Account Number: [id number] Date of Birth/Sex: Oct 18, 1990 (24 y.o. Female) Treating RN: Huel CoventryWoody, Tina Ibarra Primary Care Physician: PATIENT, NO Other Clinician: Referring Physician: Jene Ibarra, ROBERT Treating Physician/Extender: Tina Ibarra in Treatment: 1 Visit Information History Since Last Visit Added or deleted any medications: No Patient Arrived: Ambulatory Any new allergies or adverse No Arrival Time: 12:59 reactions: Accompanied By: friend Had a fall or experienced change in No Transfer Assistance: None activities of daily living that may Patient Identification Verified: Yes affect Secondary Verification Process Yes risk of falls: Completed: Signs or symptoms of abuse/neglect No since last visito Hospitalized since last visit: No Has Dressing in Place as Yes Prescribed: Pain Present Now: Unable to Respond Electronic Signature(s) Signed: 05/23/2015 5:30:12 PM By: Tina GurneyWoody, RN, BSN, Kim RN, BSN Entered By: Tina GurneyWoody, RN, BSN, Tina Ibarra on 05/23/2015 12:59:29 Tramell, Tina GlassingJASMINE N. ([id number]) -------------------------------------------------------------------------------- Clinic Level of Care Assessment Details Patient Name: Ibarra, Tina N. Date of Service: 05/23/2015 12:45 PM Medical Record Number: [id number] Patient Account Number: [id number] Date of Birth/Sex: Oct 18, 1990 (24 y.o. Female) Treating RN: Huel CoventryWoody, Tina Ibarra Primary Care Physician: PATIENT, NO Other Clinician: Referring Physician: Jene Ibarra, ROBERT Treating Physician/Extender: Tina Ibarra in Treatment: 1 Clinic Level of Care Assessment Items TOOL 4 Quantity Score []  - Use when only an EandM is performed on FOLLOW-UP visit 0 ASSESSMENTS - Nursing Assessment / Reassessment X - Reassessment of Co-morbidities (includes updates in  patient status) 1 10 []  - Reassessment of Adherence to Treatment Plan 0 ASSESSMENTS - Wound and Skin Assessment / Reassessment []  - Simple Wound Assessment / Reassessment - one wound 0 []  - Complex Wound Assessment / Reassessment - multiple wounds 0 []  - Dermatologic / Skin Assessment (not related to wound area) 0 ASSESSMENTS - Focused Assessment []  - Circumferential Edema Measurements - multi extremities 0 []  - Nutritional Assessment / Counseling / Intervention 0 []  - Lower Extremity Assessment (monofilament, tuning fork, pulses) 0 []  - Peripheral Arterial Disease Assessment (using hand held doppler) 0 ASSESSMENTS - Ostomy and/or Continence Assessment and Care []  - Incontinence Assessment and Management 0 []  - Ostomy Care Assessment and Management (repouching, etc.) 0 PROCESS - Coordination of Care X - Simple Patient / Family Education for ongoing care 1 15 []  - Complex (extensive) Patient / Family Education for ongoing care 0 []  - Staff obtains ChiropractorConsents, Records, Test Results / Process Orders 0 []  - Staff telephones HHA, Nursing Homes / Clarify orders / etc 0 []  - Routine Transfer to another Facility (non-emergent condition) 0 Cordle, Jaki N. ([id number]) []  - Routine Hospital Admission (non-emergent condition) 0 []  - New Admissions / Manufacturing engineernsurance Authorizations / Ordering NPWT, Apligraf, etc. 0 []  - Emergency Hospital Admission (emergent condition) 0 X - Simple Discharge Coordination 1 10 []  - Complex (extensive) Discharge Coordination 0 PROCESS - Special Needs []  - Pediatric / Minor Patient Management 0 []  - Isolation Patient Management 0 []  - Hearing / Language / Visual special needs 0 []  - Assessment of Community assistance (transportation, D/C planning, etc.) 0 []  - Additional assistance / Altered mentation 0 []  - Support Surface(s) Assessment (bed, cushion, seat, etc.) 0 INTERVENTIONS - Wound Cleansing / Measurement X - Simple Wound Cleansing - one wound 1 5 []  - Complex  Wound Cleansing - multiple wounds 0 X - Wound Imaging (photographs - any number of wounds) 1 5 []  - Wound Tracing (instead of  photographs) 0 X - Simple Wound Measurement - one wound 1 5 []  - Complex Wound Measurement - multiple wounds 0 INTERVENTIONS - Wound Dressings X - Small Wound Dressing one or multiple wounds 1 10 []  - Medium Wound Dressing one or multiple wounds 0 []  - Large Wound Dressing one or multiple wounds 0 []  - Application of Medications - topical 0 []  - Application of Medications - injection 0 INTERVENTIONS - Miscellaneous []  - External ear exam 0 Ibarra, Tina N. ([id number]) []  - Specimen Collection (cultures, biopsies, blood, body fluids, etc.) 0 []  - Specimen(s) / Culture(s) sent or taken to Lab for analysis 0 []  - Patient Transfer (multiple staff / Michiel Sites Lift / Similar devices) 0 []  - Simple Staple / Suture removal (25 or less) 0 []  - Complex Staple / Suture removal (26 or more) 0 []  - Hypo / Hyperglycemic Management (close monitor of Blood Glucose) 0 []  - Ankle / Brachial Index (ABI) - do not check if billed separately 0 X - Vital Signs 1 5 Has the patient been seen at the hospital within the last three years: Yes Total Score: 65 Level Of Care: New/Established - Level 2 Electronic Signature(s) Signed: 05/23/2015 5:30:12 PM By: Tina Gurney, RN, BSN, Kim RN, BSN Entered By: Tina Gurney, RN, BSN, Tina Ibarra on 05/23/2015 13:19:35 Bachtel, Tina Ibarra ([id number]) -------------------------------------------------------------------------------- Encounter Discharge Information Details Patient Name: Tina Ibarra. Date of Service: 05/23/2015 12:45 PM Medical Record Number: [id number] Patient Account Number: [id number] Date of Birth/Sex: 15-Nov-1990 (24 y.o. Female) Treating RN: Huel Coventry Primary Care Physician: PATIENT, NO Other Clinician: Referring Physician: Jene Every Treating Physician/Extender: Tina Re in Treatment: 1 Encounter Discharge Information  Items Discharge Pain Level: 0 Discharge Condition: Stable Ambulatory Status: Ambulatory Discharge Destination: Home Transportation: Private Auto Accompanied By: self Schedule Follow-up Appointment: Yes Medication Reconciliation completed and provided to Patient/Care Yes Supriya Beaston: Provided on Clinical Summary of Care: 05/23/2015 Form Type Recipient Paper Patient JB Electronic Signature(s) Signed: 05/23/2015 5:30:12 PM By: Tina Gurney RN, BSN, Kim RN, BSN Previous Signature: 05/23/2015 1:18:09 PM Version By: Gwenlyn Perking Entered By: Tina Gurney RN, BSN, Tina Ibarra on 05/23/2015 13:21:03 Tina Ibarra ([id number]) -------------------------------------------------------------------------------- Lower Extremity Assessment Details Patient Name: Lacombe, Ericka N. Date of Service: 05/23/2015 12:45 PM Medical Record Number: [id number] Patient Account Number: [id number] Date of Birth/Sex: 28-Jul-1990 (24 y.o. Female) Treating RN: Huel Coventry Primary Care Physician: PATIENT, NO Other Clinician: Referring Physician: Jene Every Treating Physician/Extender: Tina Re in Treatment: 1 Edema Assessment Assessed: [Left: No] [Right: No] E[Left: dema] [Right: :] Calf Left: Right: Point of Measurement: 32 cm From Medial Instep cm 42.5 cm Ankle Left: Right: Point of Measurement: 10 cm From Medial Instep cm 23.5 cm Vascular Assessment Pulses: Posterior Tibial Dorsalis Pedis Palpable: [Right:Yes] Extremity colors, hair growth, and conditions: Extremity Color: [Right:Normal] Temperature of Extremity: [Right:Warm] Capillary Refill: [Right:< 3 seconds] Toe Nail Assessment Left: Right: Thick: No Discolored: No Deformed: No Improper Length and Hygiene: No Electronic Signature(s) Signed: 05/23/2015 5:30:12 PM By: Tina Gurney, RN, BSN, Kim RN, BSN Entered By: Tina Gurney, RN, BSN, Tina Ibarra on 05/23/2015 13:01:52 Proffit, Tina Ibarra  ([id number]) -------------------------------------------------------------------------------- Multi Wound Chart Details Patient Name: Tina Ibarra. Date of Service: 05/23/2015 12:45 PM Medical Record Number: [id number] Patient Account Number: [id number] Date of Birth/Sex: 10/08/1990 (24 y.o. Female) Treating RN: Huel Coventry Primary Care Physician: PATIENT, NO Other Clinician: Referring Physician: Jene Every Treating Physician/Extender: Tina Re in Treatment: 1 Vital Signs Height(in): 65 Pulse(bpm): 92 Weight(lbs): 231 Blood Pressure 125/65 (mmHg): Body Mass Index(BMI): 38 Temperature(F):  98.3 Respiratory Rate 16 (breaths/min): Photos: [1:No Photos] [2:No Photos] [N/A:N/A] Wound Location: [1:Right, Proximal, Medial, Anterior Lower Leg] [2:Right, Distal, Medial, Anterior Lower Leg] [N/A:N/A] Wounding Event: [1:Trauma] [2:Trauma] [N/A:N/A] Primary Etiology: [1:Trauma, Other] [2:Trauma, Other] [N/A:N/A] Date Acquired: [1:04/24/2015] [2:04/24/2015] [N/A:N/A] Ibarra of Treatment: [1:1] [2:1] [N/A:N/A] Wound Status: [1:Open] [2:Open] [N/A:N/A] Measurements L x W x D 1.9x2.5x0.9 [2:0.1x0.1x0.1] [N/A:N/A] (cm) Area (cm) : [1:3.731] [2:0.008] [N/A:N/A] Volume (cm) : [1:3.358] [2:0.001] [N/A:N/A] % Reduction in Area: [1:52.50%] [2:0.00%] [N/A:N/A] % Reduction in Volume: 61.10% [2:0.00%] [N/A:N/A] Classification: [1:Full Thickness Without Exposed Support Structures] [2:Unclassifiable] [N/A:N/A] Periwound Skin Texture: No Abnormalities Noted [2:No Abnormalities Noted] [N/A:N/A] Periwound Skin [1:No Abnormalities Noted] [2:No Abnormalities Noted] [N/A:N/A] Moisture: Periwound Skin Color: No Abnormalities Noted [2:No Abnormalities Noted] [N/A:N/A] Tenderness on [1:No] [2:No] [N/A:N/A] Treatment Notes Electronic Signature(s) Signed: 05/23/2015 5:30:12 PM By: Tina Gurney, RN, BSN, Kim RN, BSN Warriner, Tina Ibarra ([id number]) Entered By: Tina Gurney, RN, BSN, Tina Ibarra on 05/23/2015  13:05:32 Tina Ibarra ([id number]) -------------------------------------------------------------------------------- Multi-Disciplinary Care Plan Details Patient Name: Tina Ibarra. Date of Service: 05/23/2015 12:45 PM Medical Record Number: [id number] Patient Account Number: [id number] Date of Birth/Sex: 1990-10-14 (24 y.o. Female) Treating RN: Huel Coventry Primary Care Physician: PATIENT, NO Other Clinician: Referring Physician: Jene Every Treating Physician/Extender: Tina Re in Treatment: 1 Active Inactive Abuse / Safety / Falls / Self Care Management Nursing Diagnoses: Knowledge deficit related to: safety; personal, health (wound), emergency Goals: Patient/caregiver will verbalize/demonstrate measures taken to improve the patient's personal safety Date Initiated: 05/16/2015 Goal Status: Active Interventions: Assess self care needs on admission and as needed Notes: Orientation to the Wound Care Program Nursing Diagnoses: Knowledge deficit related to the wound healing center program Goals: Patient/caregiver will verbalize understanding of the Wound Healing Center Program Date Initiated: 05/16/2015 Goal Status: Active Interventions: Provide education on orientation to the wound center Notes: Wound/Skin Impairment Nursing Diagnoses: Knowledge deficit related to smoking impact on wound healing Goals: Patient/caregiver will verbalize understanding of skin care regimen Date Initiated: 05/16/2015 SUANN, KLIER ([id number]) Goal Status: Active Interventions: Assess ulceration(s) every visit Notes: Electronic Signature(s) Signed: 05/23/2015 5:30:12 PM By: Tina Gurney, RN, BSN, Kim RN, BSN Entered By: Tina Gurney, RN, BSN, Tina Ibarra on 05/23/2015 13:05:25 Perrault, Tina Ibarra ([id number]) -------------------------------------------------------------------------------- Pain Assessment Details Patient Name: Tina Ibarra. Date of Service: 05/23/2015 12:45 PM Medical  Record Number: [id number] Patient Account Number: [id number] Date of Birth/Sex: 10/18/1990 (24 y.o. Female) Treating RN: Huel Coventry Primary Care Physician: PATIENT, NO Other Clinician: Referring Physician: Jene Every Treating Physician/Extender: Tina Re in Treatment: 1 Active Problems Location of Pain Severity and Description of Pain Patient Has Paino No Site Locations Pain Management and Medication Current Pain Management: Electronic Signature(s) Signed: 05/23/2015 5:30:12 PM By: Tina Gurney, RN, BSN, Kim RN, BSN Entered By: Tina Gurney, RN, BSN, Tina Ibarra on 05/23/2015 12:59:35 Tina Ibarra ([id number]) -------------------------------------------------------------------------------- Patient/Caregiver Education Details Patient Name: Tina Ibarra. Date of Service: 05/23/2015 12:45 PM Medical Record Number: [id number] Patient Account Number: [id number] Date of Birth/Gender: 1990-10-24 (25 y.o. Female) Treating RN: Huel Coventry Primary Care Physician: PATIENT, NO Other Clinician: Referring Physician: Jene Every Treating Physician/Extender: Tina Re in Treatment: 1 Education Assessment Education Provided To: Patient Education Topics Provided Wound/Skin Impairment: Handouts: Caring for Your Ulcer, Other: continue wound care as presceribed Methods: Demonstration, Explain/Verbal Responses: State content correctly Electronic Signature(s) Signed: 05/23/2015 5:30:12 PM By: Tina Gurney, RN, BSN, Kim RN, BSN Entered By: Tina Gurney, RN, BSN, Tina Ibarra on 05/23/2015 13:21:23 Tina Ibarra ([id number]) -------------------------------------------------------------------------------- Wound Assessment Details Patient Name: Shontz, Deshara N.  Date of Service: 05/23/2015 12:45 PM Medical Record Number: [id number] Patient Account Number: [id number] Date of Birth/Sex: 27-Sep-1990 (24 y.o. Female) Treating RN: Huel Coventry Primary Care Physician: PATIENT, NO Other Clinician: Referring  Physician: Jene Every Treating Physician/Extender: Tina Re in Treatment: 1 Wound Status Wound Number: 1 Primary Etiology: Trauma, Other Wound Location: Right Lower Leg - Medial, Wound Status: Open Anterior, Proximal Comorbid History: Asthma Wounding Event: Trauma Date Acquired: 04/24/2015 Ibarra Of Treatment: 1 Clustered Wound: No Photos Photo Uploaded By: Tina Gurney, RN, BSN, Tina Ibarra on 05/23/2015 13:24:23 Wound Measurements Length: (cm) 1.9 Width: (cm) 2.5 Depth: (cm) 0.9 Area: (cm) 3.731 Volume: (cm) 3.358 % Reduction in Area: 52.5% % Reduction in Volume: 61.1% Epithelialization: Small (1-33%) Tunneling: No Undermining: No Wound Description Full Thickness Without Exposed Classification: Support Structures Wound Margin: Epibole Exudate Medium Amount: Exudate Type: Serosanguineous Exudate Color: red, brown Wound Bed Granulation Amount: Medium (34-66%) Exposed Structure Granulation Quality: Red Fascia Exposed: No Matar, Alissah N. ([id number]) Necrotic Amount: Small (1-33%) Fat Layer Exposed: No Necrotic Quality: Adherent Slough Tendon Exposed: No Muscle Exposed: No Joint Exposed: No Bone Exposed: No Limited to Skin Breakdown Periwound Skin Texture Texture Color No Abnormalities Noted: No No Abnormalities Noted: No Callus: No Atrophie Blanche: No Crepitus: No Cyanosis: No Excoriation: No Ecchymosis: No Fluctuance: No Erythema: No Friable: No Hemosiderin Staining: Yes Induration: No Mottled: No Localized Edema: No Pallor: No Rash: No Rubor: No Scarring: Yes Moisture No Abnormalities Noted: No Dry / Scaly: No Maceration: No Moist: No Wound Preparation Ulcer Cleansing: Rinsed/Irrigated with Saline Topical Anesthetic Applied: Other: lidocaine 4%, Treatment Notes Wound #1 (Right, Proximal, Medial, Anterior Lower Leg) 1. Cleansed with: Clean wound with Normal Saline 2. Anesthetic Topical Lidocaine 4% cream to wound bed prior to  debridement 4. Dressing Applied: Other dressing (specify in notes) 5. Secondary Dressing Applied Non-Adherent pad Gauze and Kerlix/Conform Notes [employer] Electronic Signature(s) Signed: 05/23/2015 5:30:12 PM By: Tina Gurney, RN, BSN, Kim RN, BSN Entered By: Tina Gurney, RN, BSN, Tina Ibarra on 05/23/2015 13:06:16 DOMONIC, HISCOX ([id number]) DONESHA, WALLANDER ([id number]) -------------------------------------------------------------------------------- Wound Assessment Details Patient Name: Kyte, Bethanie N. Date of Service: 05/23/2015 12:45 PM Medical Record Number: [id number] Patient Account Number: [id number] Date of Birth/Sex: [birth year] (24 y.o. Female) Treating RN: Huel Coventry Primary Care Physician: PATIENT, NO Other Clinician: Referring Physician: Jene Every Treating Physician/Extender: Tina Re in Treatment: 1 Wound Status Wound Number: 2 Primary Etiology: Trauma, Other Wound Location: Right, Distal, Medial, Anterior Wound Status: Open Lower Leg Wounding Event: Trauma Date Acquired: 04/24/2015 Ibarra Of Treatment: 1 Clustered Wound: No Photos Photo Uploaded By: Tina Gurney, RN, BSN, Tina Ibarra on 05/23/2015 13:24:24 Wound Measurements Length: (cm) 0.1 Width: (cm) 0.1 Depth: (cm) 0.1 Area: (cm) 0.008 Volume: (cm) 0.001 % Reduction in Area: 0% % Reduction in Volume: 0% Wound Description Classification: Unclassifiable Periwound Skin Texture Texture Color No Abnormalities Noted: No No Abnormalities Noted: No Moisture No Abnormalities Noted: No Treatment Notes Wound #2 (Right, Distal, Medial, Anterior Lower Leg) Didio, Wilberta N. ([id number]) 5. Secondary Dressing Applied Kerlix/Conform Non-Adherent pad Electronic Signature(s) Signed: 05/23/2015 5:30:12 PM By: Tina Gurney, RN, BSN, Kim RN, BSN Entered By: Tina Gurney, RN, BSN, Tina Ibarra on 05/23/2015 13:03:57 Cosby, Tina Ibarra ([id number]) -------------------------------------------------------------------------------- Vitals  Details Patient Name: Tina Ibarra. Date of Service: 05/23/2015 12:45 PM Medical Record Number: [id number] Patient Account Number: [id number] Date of Birth/Sex: [birth year] (24 y.o. Female) Treating RN: Huel Coventry Primary Care Physician: PATIENT, NO Other Clinician: Referring Physician: Jene Every Treating Physician/Extender: Tina Re in Treatment: 1 Vital Signs Time  Taken: 12:59 Temperature (F): 98.3 Height (in): 65 Pulse (bpm): 92 Weight (lbs): 231 Respiratory Rate (breaths/min): 16 Body Mass Index (BMI): 38.4 Blood Pressure (mmHg): 125/65 Reference Range: 80 - 120 mg / dl Electronic Signature(s) Signed: 05/23/2015 5:30:12 PM By: Tina Gurney, RN, BSN, Kim RN, BSN Entered By: Tina Gurney, RN, BSN, Tina Ibarra on 05/23/2015 13:00:04

## 2015-05-30 ENCOUNTER — Ambulatory Visit: Payer: Self-pay | Admitting: Surgery

## 2015-06-09 ENCOUNTER — Ambulatory Visit: Payer: Self-pay | Admitting: Surgery

## 2015-06-22 ENCOUNTER — Emergency Department
Admission: EM | Admit: 2015-06-22 | Discharge: 2015-06-22 | Disposition: A | Discharge status: Home / Self Care | Payer: Self-pay | Attending: Emergency Medicine | Admitting: Emergency Medicine

## 2015-06-22 ENCOUNTER — Encounter: Payer: Self-pay | Admitting: *Deleted

## 2015-06-22 DIAGNOSIS — H209 Unspecified iridocyclitis: Secondary | ICD-10-CM | POA: Insufficient documentation

## 2015-06-22 DIAGNOSIS — J45909 Unspecified asthma, uncomplicated: Secondary | ICD-10-CM | POA: Insufficient documentation

## 2015-06-22 DIAGNOSIS — Z91018 Allergy to other foods: Secondary | ICD-10-CM | POA: Insufficient documentation

## 2015-06-22 DIAGNOSIS — Z792 Long term (current) use of antibiotics: Secondary | ICD-10-CM | POA: Insufficient documentation

## 2015-06-22 DIAGNOSIS — H5712 Ocular pain, left eye: Secondary | ICD-10-CM

## 2015-06-22 DIAGNOSIS — F17210 Nicotine dependence, cigarettes, uncomplicated: Secondary | ICD-10-CM | POA: Insufficient documentation

## 2015-06-22 DIAGNOSIS — Z79899 Other long term (current) drug therapy: Secondary | ICD-10-CM | POA: Insufficient documentation

## 2015-06-22 DIAGNOSIS — H169 Unspecified keratitis: Secondary | ICD-10-CM | POA: Insufficient documentation

## 2015-06-22 MED ORDER — FLUORESCEIN SODIUM 1 MG OP STRP
ORAL_STRIP | OPHTHALMIC | Status: AC
  Administered 2015-06-22: 05:00:00
  Filled 2015-06-22: qty 1

## 2015-06-22 MED ORDER — TETRACAINE HCL 0.5 % OP SOLN
2.0000 [drp] | Freq: Once | OPHTHALMIC | Status: AC
  Administered 2015-06-22: 2 [drp] via OPHTHALMIC
  Filled 2015-06-22: qty 2

## 2015-06-22 MED ORDER — ERYTHROMYCIN 5 MG/GM OP OINT
TOPICAL_OINTMENT | Freq: Once | OPHTHALMIC | Status: AC
  Administered 2015-06-22: 1 via OPHTHALMIC
  Filled 2015-06-22: qty 1

## 2015-06-22 MED ORDER — OXYCODONE-ACETAMINOPHEN 5-325 MG PO TABS
1.0000 | ORAL_TABLET | Freq: Once | ORAL | Status: AC
  Administered 2015-06-22: 1 via ORAL
  Filled 2015-06-22: qty 1

## 2015-06-22 MED ORDER — MOXIFLOXACIN HCL 0.5 % OP SOLN: 1.0000 [drp] | Freq: Every day | OPHTHALMIC | Status: AC

## 2015-06-22 NOTE — ED Notes (Signed)
Pt states L eye pain starting on Sat. Pt wears contacts and has been wearing this pair x 1 month. Pt c/o photophobia and inability to open her L eye w/o pain.

## 2015-06-22 NOTE — ED Notes (Signed)
Discussed discharge instructions, prescriptions, and follow-up care with patient. No questions or concerns at this time. Pt stable at discharge.  

## 2015-06-22 NOTE — ED Provider Notes (Signed)
[facility] Emergency Department Provider Note   ____________________________________________  Time seen: Approximately 445 AM  I have reviewed the triage vital signs and the nursing notes.   HISTORY  Chief Complaint Eye Pain    HPI Tina Ibarra is a 25 y.o. female who comes into the hospital today with eye pain. The patient reports that it started today. She cannot look out of it. She is been wearing contacts for a long time and she is unsure exactly how long she was wearing them. The patient reports that she is not sure exactly when she's posted take them out. The patient was able to take out the contact out arrived. She denies any drainage from her eye just has some significant discomfort. She does not remember anything getting into her eye but wants to know what's going on with her eye. The patient rates her pain a 10 out of 10 in intensity.The patient's left eye is red and watering.   Past Medical History  Diagnosis Date  . Asthma     There are no active problems to display for this patient.   History reviewed. No pertinent past surgical history.  Current Outpatient Rx  Name  Route  Sig  Dispense  Refill  . HYDROcodone-acetaminophen (NORCO) 5-325 MG tablet   Oral   Take 1-2 tablets by mouth every 4 (four) hours as needed for moderate pain.   15 tablet   0   . moxifloxacin (VIGAMOX) 0.5 % ophthalmic solution   Left Eye   Place 1 drop into the left eye 6 (six) times daily.   3 mL   0   . sulfamethoxazole-trimethoprim (BACTRIM DS,SEPTRA DS) 800-160 MG tablet   Oral   Take 1 tablet by mouth 2 (two) times daily.   20 tablet   0     Allergies Mushroom extract complex  History reviewed. No pertinent family history.  Social History Social History  Substance Use Topics  . Smoking status: Current Every Day Smoker    Types: Cigarettes  . Smokeless tobacco: None  . Alcohol Use: Yes    Review of Systems Constitutional: No  fever/chills Eyes: Left eye blurry vision with some redness and eye pain ENT: No sore throat. Cardiovascular: Denies chest pain. Respiratory: Denies shortness of breath. Gastrointestinal: No abdominal pain.  No nausea, no vomiting.  No diarrhea.  No constipation. Genitourinary: Negative for dysuria. Musculoskeletal: Negative for back pain. Skin: Negative for rash. Neurological: Negative for headaches, focal weakness or numbness.  10-point ROS otherwise negative.  ____________________________________________   PHYSICAL EXAM:  VITAL SIGNS: ED Triage Vitals  Enc Vitals Group     BP 06/22/15  0805 112/67     Pulse 06/22/15  0805 72     Resp 06/22/15  0805 18     Temp 06/22/15  0805 98.4     Temp src --      SpO2 06/22/15  0805 100%     Weight --      Height --      Head Cir --      Peak Flow --      Pain Score 06/22/15 0403 10     Pain Loc --      Pain Edu? --      Excl. in GC? --     Constitutional: Alert and oriented. Well appearing and in severe distress. Eyes: Conjunctivae and sclera are injected. No corneal abrasion or ulcer noted on exam no hypopyon or hyphema PERRL.  EOMI. intraocular pressures in the left eye 22 Head: Atraumatic. Nose: No congestion/rhinnorhea. Mouth/Throat: Mucous membranes are moist.  Oropharynx non-erythematous. Cardiovascular: Normal rate, regular rhythm. Grossly normal heart sounds.  Good peripheral circulation. Respiratory: Normal respiratory effort.  No retractions. Lungs CTAB. Gastrointestinal: Soft and nontender. No distention.  Musculoskeletal: No lower extremity tenderness nor edema.   Neurologic:  Normal speech and language.  Skin:  Skin is warm, dry and intact.  Psychiatric: Mood and affect are normal.   ____________________________________________   LABS (all labs ordered are listed, but only abnormal results are displayed)  Labs Reviewed - No data to  display ____________________________________________  EKG  None ____________________________________________  RADIOLOGY  None ____________________________________________   PROCEDURES  Procedure(s) performed: None  Critical Care performed: No  ____________________________________________   INITIAL IMPRESSION / ASSESSMENT AND PLAN / ED COURSE  Pertinent labs & imaging results that were available during my care of the patient were reviewed by me and considered in my medical decision making (see chart for details).  This is a 25 year old female who comes into the hospital with some eye pain. The patient has been wearing her contacts for longer than she is supposed to. My initial concern was for a corneal ulcer versus abrasion but I did not see anything on the flourescin exam. I did put some erythromycin ointment to the patient's INR also gave her a dose of Percocet. My concern is the patient may have some keratitis versus uveitis. I did contact the ophthalmologist on call who recommended placing the patient on Vigamox drops. The patient will receive a prescription for antibiotics and she'll be discharged home. I informed her not to put her contacts back in her eye until she is evaluated by ophthalmology. ____________________________________________   FINAL CLINICAL IMPRESSION(S) / ED DIAGNOSES  Final diagnoses:  Eye pain, left  Keratitis  Uveitis      NEW MEDICATIONS STARTED DURING THIS VISIT:  New Prescriptions   MOXIFLOXACIN (VIGAMOX) 0.5 % OPHTHALMIC SOLUTION    Place 1 drop into the left eye 6 (six) times daily.     Note:  This document was prepared using Dragon voice recognition software and may include unintentional dictation errors.    Rebecka Apley, MD 06/22/15 [phone]

## 2015-06-22 NOTE — ED Notes (Signed)
Retrieved tonometer from Flex for MD

## 2015-06-22 NOTE — ED Notes (Addendum)
Patient states she wears disposable contacts and change out every 2 weeks but occasionally she will stretch that out another week.  She was able to successfully remove her contact when this pain started up.  She feels the pain all the time, especially when she rotates her affected eye with her eyelid closed.  Patient cannot recall anything getting into her eye (and her rubbing it out), nor can she recall if she has unintentionally left (or lost) a contact in that eye before.  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

## 2015-06-22 NOTE — ED Notes (Signed)
Wood's Lamp, Fluorescein strip, and MD at bedside.

## 2016-07-04 IMAGING — CT CT CERVICAL SPINE W/O CM
3 of 6 series · 10 of 35 positions shown, 12 images · non-contrast
Comparison: None.

CLINICAL DATA: Pain.

EXAM:
CT HEAD WITHOUT CONTRAST
CT CERVICAL SPINE WITHOUT CONTRAST
TECHNIQUE: Multidetector CT imaging of the head and cervical spine was
performed following the standard protocol without intravenous
contrast. Multiplanar CT image reconstructions of the cervical spine
were also generated.

[Series 304: axial · axial · 0.39mm/px · z∈[+160,+207]mm · 2 of 75 slices shown, 3 images]
[im 25/75  soft-tissue]
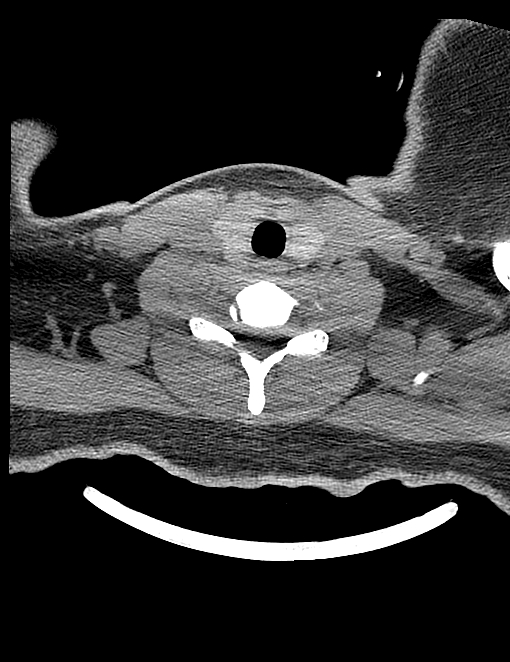
[im 25/75  bone]
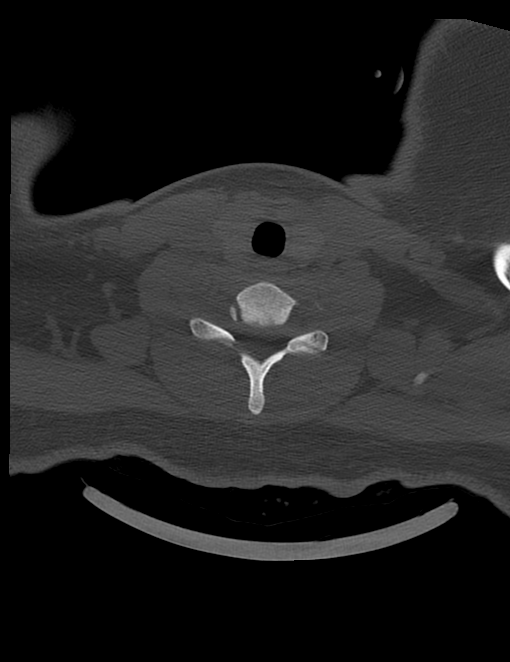
[im 50/75  bone]
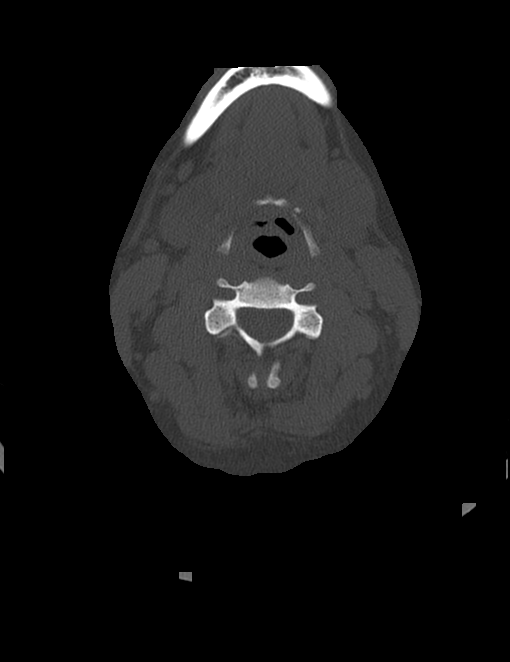

[Series 305: cor · coronal · 0.39mm/px · 3 of 37 slices shown]
[im 8/37  bone]
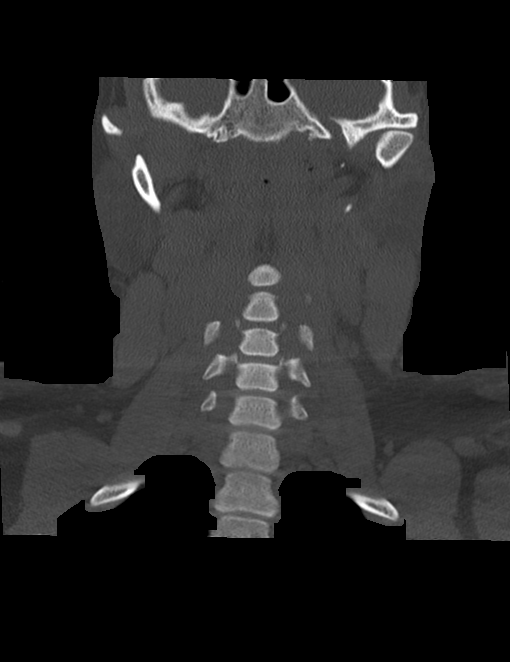
[im 15/37  bone]
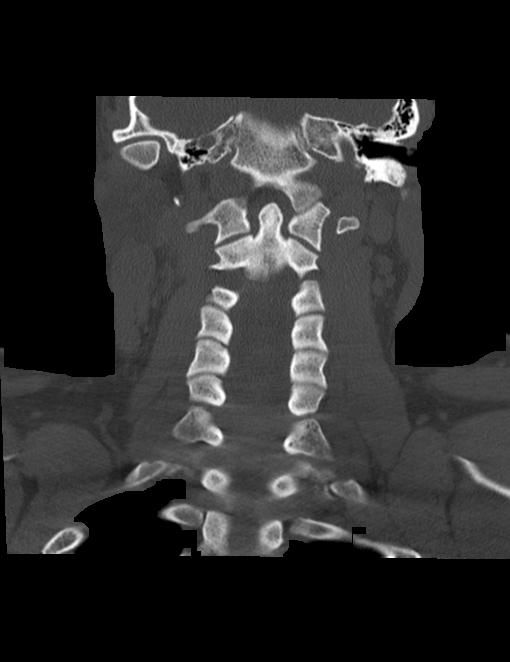
[im 22/37  bone]
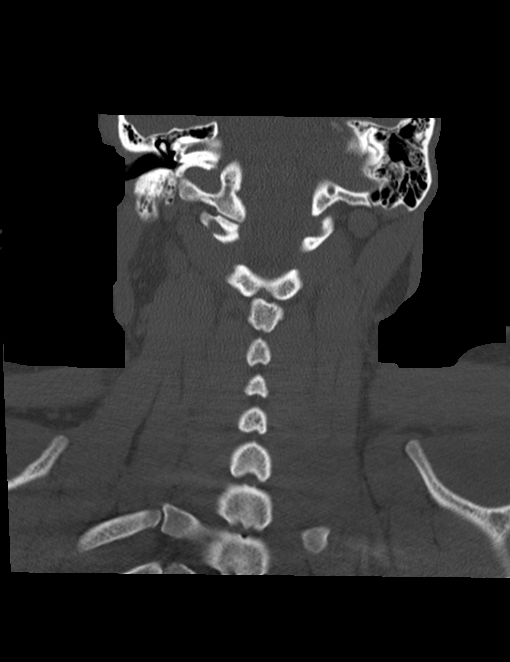

[Series 306: sag · sagittal · 0.39mm/px · 5 of 72 slices shown, 6 images]
[im 24/72  bone]
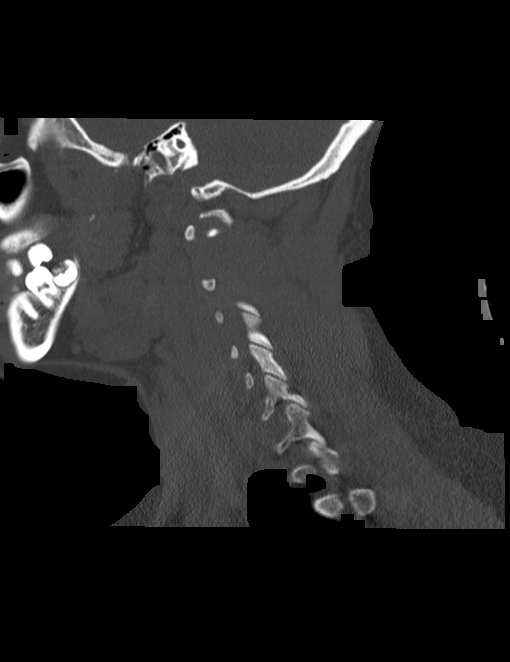
[im 30/72  bone]
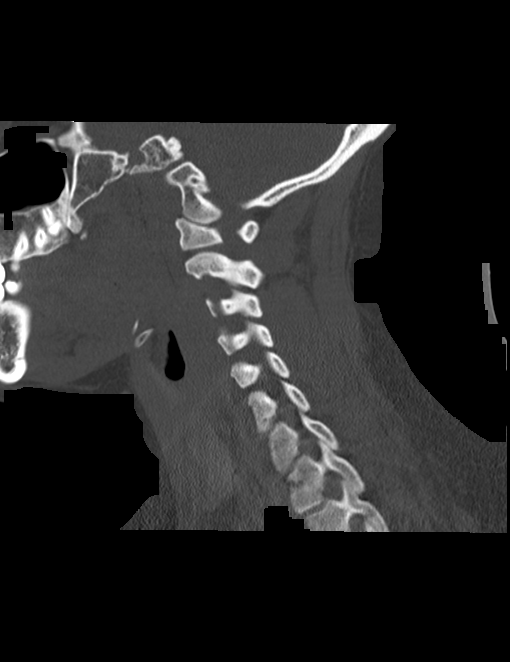
[im 36/72  soft-tissue]
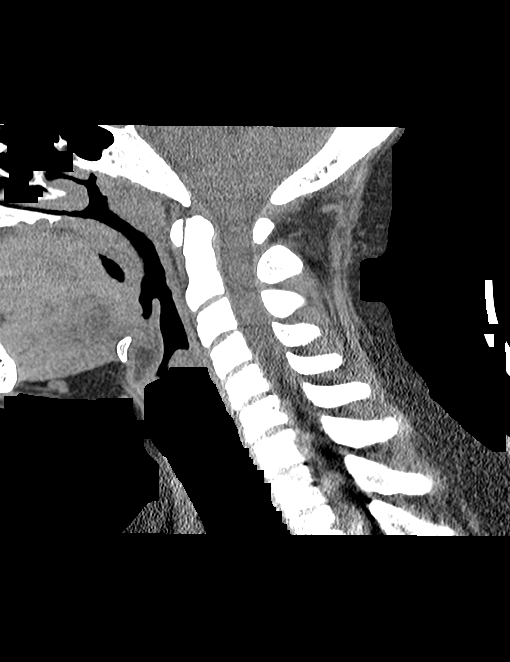
[im 36/72  bone]
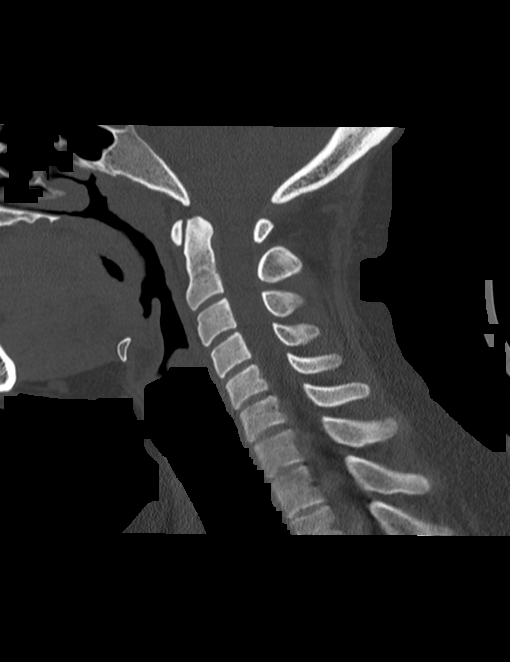
[im 42/72  bone]
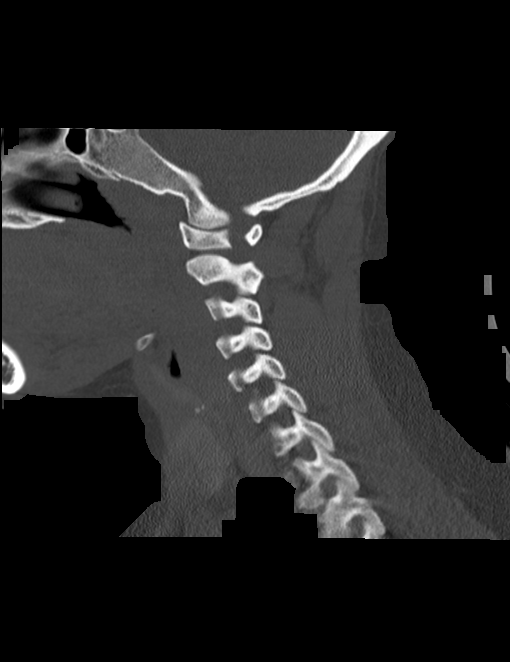
[im 48/72  bone]
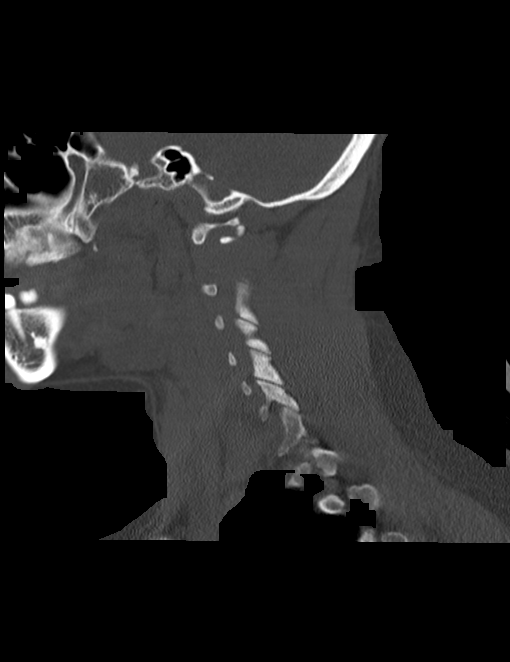

[10 of 35 positions shown; findings below may reference images not displayed]

FINDINGS: CT HEAD FINDINGS

Bony calvarium appears intact. No mass effect or midline shift is
noted. Ventricular size is within normal limits. There is no
evidence of mass lesion, hemorrhage or acute infarction.

CT CERVICAL SPINE FINDINGS

No fracture or spondylolisthesis is noted. Disc spaces and posterior
facet joints appear intact. Visualized lung apices appear normal.
IMPRESSION: Normal head CT.

No abnormality seen in the cervical spine.

## 2017-07-20 ENCOUNTER — Encounter: Payer: Self-pay | Admitting: Emergency Medicine

## 2017-07-20 ENCOUNTER — Emergency Department
Admission: EM | Admit: 2017-07-20 | Discharge: 2017-07-20 | Disposition: A | Discharge status: Home / Self Care | Payer: Self-pay | Attending: Emergency Medicine | Admitting: Emergency Medicine

## 2017-07-20 ENCOUNTER — Other Ambulatory Visit: Payer: Self-pay

## 2017-07-20 ENCOUNTER — Emergency Department: Payer: Self-pay

## 2017-07-20 DIAGNOSIS — Y939 Activity, unspecified: Secondary | ICD-10-CM | POA: Insufficient documentation

## 2017-07-20 DIAGNOSIS — W228XXA Striking against or struck by other objects, initial encounter: Secondary | ICD-10-CM | POA: Insufficient documentation

## 2017-07-20 DIAGNOSIS — Y929 Unspecified place or not applicable: Secondary | ICD-10-CM | POA: Insufficient documentation

## 2017-07-20 DIAGNOSIS — S060X0A Concussion without loss of consciousness, initial encounter: Secondary | ICD-10-CM | POA: Insufficient documentation

## 2017-07-20 DIAGNOSIS — S01112A Laceration without foreign body of left eyelid and periocular area, initial encounter: Secondary | ICD-10-CM | POA: Insufficient documentation

## 2017-07-20 DIAGNOSIS — Y999 Unspecified external cause status: Secondary | ICD-10-CM | POA: Insufficient documentation

## 2017-07-20 DIAGNOSIS — J45909 Unspecified asthma, uncomplicated: Secondary | ICD-10-CM | POA: Insufficient documentation

## 2017-07-20 DIAGNOSIS — F17210 Nicotine dependence, cigarettes, uncomplicated: Secondary | ICD-10-CM | POA: Insufficient documentation

## 2017-07-20 DIAGNOSIS — S0181XA Laceration without foreign body of other part of head, initial encounter: Secondary | ICD-10-CM

## 2017-07-20 MED ORDER — LIDOCAINE HCL (PF) 1 % IJ SOLN
5.0000 mL | Freq: Once | INTRAMUSCULAR | Status: AC
  Administered 2017-07-20: 5 mL via INTRADERMAL

## 2017-07-20 MED ORDER — LIDOCAINE HCL (PF) 1 % IJ SOLN
INTRAMUSCULAR | Status: AC
  Administered 2017-07-20: 5 mL via INTRADERMAL
  Filled 2017-07-20: qty 5

## 2017-07-20 MED ORDER — OXYCODONE-ACETAMINOPHEN 5-325 MG PO TABS
1.0000 | ORAL_TABLET | Freq: Once | ORAL | Status: AC
  Administered 2017-07-20: 1 via ORAL

## 2017-07-20 MED ORDER — OXYCODONE-ACETAMINOPHEN 5-325 MG PO TABS
ORAL_TABLET | ORAL | Status: AC
  Filled 2017-07-20: qty 1
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
## 2017-07-20 NOTE — ED Notes (Signed)
Patient vomited x 1 in triage. 
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
## 2018-02-01 ENCOUNTER — Encounter: Payer: Self-pay | Admitting: Emergency Medicine

## 2018-02-01 ENCOUNTER — Emergency Department
Admission: EM | Admit: 2018-02-01 | Discharge: 2018-02-01 | Disposition: A | Discharge status: Home / Self Care | Payer: Medicaid Other | Attending: Emergency Medicine | Admitting: Emergency Medicine

## 2018-02-01 ENCOUNTER — Other Ambulatory Visit: Payer: Self-pay

## 2018-02-01 DIAGNOSIS — J101 Influenza due to other identified influenza virus with other respiratory manifestations: Secondary | ICD-10-CM

## 2018-02-01 DIAGNOSIS — J45909 Unspecified asthma, uncomplicated: Secondary | ICD-10-CM | POA: Insufficient documentation

## 2018-02-01 DIAGNOSIS — M791 Myalgia, unspecified site: Secondary | ICD-10-CM | POA: Insufficient documentation

## 2018-02-01 DIAGNOSIS — R05 Cough: Secondary | ICD-10-CM | POA: Insufficient documentation

## 2018-02-01 DIAGNOSIS — F17210 Nicotine dependence, cigarettes, uncomplicated: Secondary | ICD-10-CM | POA: Insufficient documentation

## 2018-02-01 LAB — INFLUENZA PANEL BY PCR (TYPE A & B)
INFLAPCR: POSITIVE — AB
Influenza B By PCR: NEGATIVE

## 2018-02-01 MED ORDER — BENZONATATE 200 MG PO CAPS: 200.0000 mg | ORAL_CAPSULE | Freq: Three times a day (TID) | ORAL | 0 refills | Status: DC | PRN

## 2018-02-01 MED ORDER — OSELTAMIVIR PHOSPHATE 75 MG PO CAPS: 75.0000 mg | ORAL_CAPSULE | Freq: Two times a day (BID) | ORAL | 0 refills | Status: DC

## 2018-02-01 NOTE — ED Provider Notes (Signed)
[facility] Emergency Department Provider Note  ____________________________________________   First MD Initiated Contact with Patient 02/01/18 1928     (approximate)  I have reviewed the triage vital signs and the nursing notes.   HISTORY  Chief Complaint Fever    HPI Tina Ibarra is a 28 y.o. female flulike symptoms, patient is complained of fever, chills, body aches.  cough, sore throat, denies vomiting, denies diarrhea; denies chest pain or sob.  Sx for 2 days, patient states her nephew had the flu.   Past Medical History:  Diagnosis Date  . Asthma     There are no active problems to display for this patient.   History reviewed. No pertinent surgical history.  Prior to Admission medications   Medication Sig Start Date End Date Taking? Authorizing Provider  benzonatate (TESSALON) 200 MG capsule Take 1 capsule (200 mg total) by mouth 3 (three) times daily as needed for cough. 02/01/18   Fisher, Roselyn BeringSusan W, PA-C  HYDROcodone-acetaminophen (NORCO) 5-325 MG tablet Take 1-2 tablets by mouth every 4 (four) hours as needed for moderate pain. 05/15/15   Beers, Charmayne Sheerharles M, PA-C  oseltamivir (TAMIFLU) 75 MG capsule Take 1 capsule (75 mg total) by mouth 2 (two) times daily. 02/01/18   Fisher, Roselyn BeringSusan W, PA-C  sulfamethoxazole-trimethoprim (BACTRIM DS,SEPTRA DS) 800-160 MG tablet Take 1 tablet by mouth 2 (two) times daily. 05/15/15   Beers, Charmayne Sheerharles M, PA-C    Allergies Mushroom extract complex  No family history on file.  Social History Social History   Tobacco Use  . Smoking status: Current Every Day Smoker    Types: Cigarettes  . Smokeless tobacco: Never Used  Substance Use Topics  . Alcohol use: Yes  . Drug use: Yes    Review of Systems  Constitutional: Positive fever/chills Eyes: No visual changes. ENT: Positive sore throat. Respiratory: Positive cough Genitourinary: Negative for dysuria. Musculoskeletal: Negative for back pain. Skin:  Negative for rash.    ____________________________________________   PHYSICAL EXAM:  VITAL SIGNS: ED Triage Vitals  Enc Vitals Group     BP 02/01/18 1909 (!) 141/93     Pulse Rate 02/01/18 1909 99     Resp 02/01/18 1909 18     Temp 02/01/18 1909 99.3 F (37.4 C)     Temp Source 02/01/18 1909 Oral     SpO2 02/01/18 1909 99 %     Weight 02/01/18 1907 230 lb (104.3 kg)     Height 02/01/18 1907 5\' 5"  (1.651 m)     Head Circumference --      Peak Flow --      Pain Score 02/01/18 1907 10     Pain Loc --      Pain Edu? --      Excl. in GC? --     Constitutional: Alert and oriented. Well appearing and in no acute distress. Eyes: Conjunctivae are normal.  Head: Atraumatic. Nose: No congestion/rhinnorhea. Mouth/Throat: Mucous membranes are moist.  Throat is normal Neck:  supple no lymphadenopathy noted Cardiovascular: Normal rate, regular rhythm. Heart sounds are normal Respiratory: Normal respiratory effort.  No retractions, lungs c t a  GU: deferred Musculoskeletal: FROM all extremities, warm and well perfused Neurologic:  Normal speech and language.  Skin:  Skin is warm, dry and intact. No rash noted. Psychiatric: Mood and affect are normal. Speech and behavior are normal.  ____________________________________________   LABS (all labs ordered are listed, but only abnormal results are displayed)  Labs Reviewed  INFLUENZA PANEL BY PCR (TYPE A & B) - Abnormal; Notable for the following components:      Result Value   Influenza A By PCR POSITIVE (*)    All other components within normal limits   ____________________________________________   ____________________________________________  RADIOLOGY    ____________________________________________   PROCEDURES  Procedure(s) performed: No  Procedures    ____________________________________________   INITIAL IMPRESSION / ASSESSMENT AND PLAN / ED COURSE  Pertinent labs & imaging results that were available  during my care of the patient were reviewed by me and considered in my medical decision making (see chart for details).   Patient 28 year old female presents with flulike symptoms.   Physical exam shows a low-grade temp, dry cough.  Otherwise unremarkable  Influenza test positive for influenza A  Patient wants a prescription for Tamiflu.  Explained her the side effects of this medication.  She still would like to take medication.  She was given a prescription for Tamiflu 75 twice daily for 5 days.  Tessalon Perles for the cough.  She is to take Tylenol and ibuprofen for fever.  Drink plenty of fluids.  Return emergency department worsening.  States she understands will comply.  She discharged stable condition.    As part of my medical decision making, I reviewed the following data within the electronic MEDICAL RECORD NUMBER Nursing notes reviewed and incorporated, Labs reviewed influenza test is positive for a, Old chart reviewed, Notes from prior ED visits and [place] Controlled Substance Database  ____________________________________________   FINAL CLINICAL IMPRESSION(S) / ED DIAGNOSES  Final diagnoses:  Influenza A      NEW MEDICATIONS STARTED DURING THIS VISIT:  New Prescriptions   BENZONATATE (TESSALON) 200 MG CAPSULE    Take 1 capsule (200 mg total) by mouth 3 (three) times daily as needed for cough.   OSELTAMIVIR (TAMIFLU) 75 MG CAPSULE    Take 1 capsule (75 mg total) by mouth 2 (two) times daily.     Note:  This document was prepared using Dragon voice recognition software and may include unintentional dictation errors.    Faythe Ghee, PA-C 02/01/18 1956    Schaevitz, Myra Rude, MD 02/01/18 [phone]

## 2018-02-01 NOTE — Discharge Instructions (Addendum)
Follow-up with your regular doctor if not better in 3 to 5 days.  Return emergency department worsening.  Take the medication as prescribed.  Take Tylenol and ibuprofen for fever as needed.  Drink plenty of fluids.

## 2018-02-01 NOTE — ED Triage Notes (Addendum)
Patient ambulatory to triage with steady gait, without difficulty or distress noted, mask in place; pt reports awoke with chills and fever, cough, body aches; no meds taken PTA

## 2018-03-16 ENCOUNTER — Other Ambulatory Visit: Payer: Self-pay

## 2018-10-12 ENCOUNTER — Emergency Department: Payer: No Typology Code available for payment source

## 2018-10-12 ENCOUNTER — Encounter: Payer: Self-pay | Admitting: Emergency Medicine

## 2018-10-12 ENCOUNTER — Emergency Department
Admission: EM | Admit: 2018-10-12 | Discharge: 2018-10-12 | Disposition: A | Discharge status: Home / Self Care | Payer: No Typology Code available for payment source | Attending: Emergency Medicine | Admitting: Emergency Medicine

## 2018-10-12 ENCOUNTER — Other Ambulatory Visit: Payer: Self-pay

## 2018-10-12 DIAGNOSIS — Y9389 Activity, other specified: Secondary | ICD-10-CM | POA: Insufficient documentation

## 2018-10-12 DIAGNOSIS — S39012A Strain of muscle, fascia and tendon of lower back, initial encounter: Secondary | ICD-10-CM | POA: Insufficient documentation

## 2018-10-12 DIAGNOSIS — Y999 Unspecified external cause status: Secondary | ICD-10-CM | POA: Insufficient documentation

## 2018-10-12 DIAGNOSIS — F17210 Nicotine dependence, cigarettes, uncomplicated: Secondary | ICD-10-CM | POA: Insufficient documentation

## 2018-10-12 DIAGNOSIS — J45909 Unspecified asthma, uncomplicated: Secondary | ICD-10-CM | POA: Insufficient documentation

## 2018-10-12 DIAGNOSIS — S161XXA Strain of muscle, fascia and tendon at neck level, initial encounter: Secondary | ICD-10-CM | POA: Diagnosis not present

## 2018-10-12 DIAGNOSIS — S199XXA Unspecified injury of neck, initial encounter: Secondary | ICD-10-CM | POA: Diagnosis present

## 2018-10-12 DIAGNOSIS — Y92410 Unspecified street and highway as the place of occurrence of the external cause: Secondary | ICD-10-CM | POA: Insufficient documentation

## 2018-10-12 MED ORDER — CYCLOBENZAPRINE HCL 10 MG PO TABS: 10.0000 mg | ORAL_TABLET | Freq: Three times a day (TID) | ORAL | 0 refills | Status: DC | PRN

## 2018-10-12 MED ORDER — IBUPROFEN 600 MG PO TABS: 600.0000 mg | ORAL_TABLET | Freq: Three times a day (TID) | ORAL | 0 refills | Status: DC | PRN

## 2018-10-12 MED ORDER — HYDROMORPHONE HCL 1 MG/ML IJ SOLN
1.0000 mg | Freq: Once | INTRAMUSCULAR | Status: AC
  Administered 2018-10-12: 1 mg via INTRAMUSCULAR
  Filled 2018-10-12: qty 1

## 2018-10-12 MED ORDER — TRAMADOL HCL 50 MG PO TABS: 50.0000 mg | ORAL_TABLET | Freq: Four times a day (QID) | ORAL | 0 refills | Status: AC | PRN

## 2018-10-12 MED ORDER — ORPHENADRINE CITRATE 30 MG/ML IJ SOLN
60.0000 mg | Freq: Two times a day (BID) | INTRAMUSCULAR | Status: DC
  Administered 2018-10-12: 60 mg via INTRAMUSCULAR
  Filled 2018-10-12: qty 2
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
## 2018-11-03 ENCOUNTER — Encounter: Payer: Self-pay | Admitting: Emergency Medicine

## 2018-11-03 ENCOUNTER — Emergency Department
Admission: EM | Admit: 2018-11-03 | Discharge: 2018-11-03 | Disposition: A | Discharge status: Home / Self Care | Payer: No Typology Code available for payment source | Attending: Emergency Medicine | Admitting: Emergency Medicine

## 2018-11-03 ENCOUNTER — Other Ambulatory Visit: Payer: Self-pay

## 2018-11-03 DIAGNOSIS — J45909 Unspecified asthma, uncomplicated: Secondary | ICD-10-CM | POA: Insufficient documentation

## 2018-11-03 DIAGNOSIS — M546 Pain in thoracic spine: Secondary | ICD-10-CM | POA: Diagnosis present

## 2018-11-03 DIAGNOSIS — M549 Dorsalgia, unspecified: Secondary | ICD-10-CM

## 2018-11-03 DIAGNOSIS — F17210 Nicotine dependence, cigarettes, uncomplicated: Secondary | ICD-10-CM | POA: Diagnosis not present

## 2018-11-03 MED ORDER — KETOROLAC TROMETHAMINE 30 MG/ML IJ SOLN
30.0000 mg | Freq: Once | INTRAMUSCULAR | Status: AC
  Administered 2018-11-03: 30 mg via INTRAMUSCULAR
  Filled 2018-11-03: qty 1

## 2018-11-03 MED ORDER — LIDOCAINE 5 % EX PTCH: 1.0000 | MEDICATED_PATCH | CUTANEOUS | 0 refills | Status: DC

## 2018-11-03 MED ORDER — LIDOCAINE 5 % EX PTCH
1.0000 | MEDICATED_PATCH | CUTANEOUS | Status: DC
  Administered 2018-11-03: 1 via TRANSDERMAL
  Filled 2018-11-03: qty 1

## 2018-11-03 MED ORDER — IBUPROFEN 600 MG PO TABS: 600.0000 mg | ORAL_TABLET | Freq: Four times a day (QID) | ORAL | 0 refills | Status: DC | PRN

## 2018-11-03 MED ORDER — ORPHENADRINE CITRATE 30 MG/ML IJ SOLN
60.0000 mg | Freq: Two times a day (BID) | INTRAMUSCULAR | Status: DC
  Administered 2018-11-03: 60 mg via INTRAMUSCULAR
  Filled 2018-11-03: qty 2

## 2018-11-03 MED ORDER — BACLOFEN 5 MG PO TABS: 5.0000 mg | ORAL_TABLET | Freq: Three times a day (TID) | ORAL | 0 refills | Status: DC | PRN
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
## 2019-07-03 ENCOUNTER — Telehealth: Payer: Self-pay | Admitting: General Practice

## 2019-07-03 NOTE — Telephone Encounter (Signed)
Individual has been contacted 3+ times regarding ED referral and has been given information regarding the clinic. No further attempts to contact the individual will be made. 

## 2019-12-01 ENCOUNTER — Emergency Department
Admission: EM | Admit: 2019-12-01 | Discharge: 2019-12-01 | Disposition: A | Discharge status: Home / Self Care | Payer: Medicaid Other | Attending: Emergency Medicine | Admitting: Emergency Medicine

## 2019-12-01 ENCOUNTER — Other Ambulatory Visit: Payer: Self-pay

## 2019-12-01 ENCOUNTER — Encounter: Payer: Self-pay | Admitting: Emergency Medicine

## 2019-12-01 DIAGNOSIS — F17210 Nicotine dependence, cigarettes, uncomplicated: Secondary | ICD-10-CM | POA: Insufficient documentation

## 2019-12-01 DIAGNOSIS — J45909 Unspecified asthma, uncomplicated: Secondary | ICD-10-CM | POA: Insufficient documentation

## 2019-12-01 DIAGNOSIS — S0591XA Unspecified injury of right eye and orbit, initial encounter: Secondary | ICD-10-CM | POA: Insufficient documentation

## 2019-12-01 DIAGNOSIS — W500XXA Accidental hit or strike by another person, initial encounter: Secondary | ICD-10-CM | POA: Insufficient documentation

## 2019-12-01 MED ORDER — CIPROFLOXACIN HCL 0.3 % OP SOLN: 1.0000 [drp] | OPHTHALMIC | 0 refills | Status: AC

## 2019-12-01 MED ORDER — FLUORESCEIN SODIUM 1 MG OP STRP
1.0000 | ORAL_STRIP | Freq: Once | OPHTHALMIC | Status: AC
  Filled 2019-12-01: qty 1

## 2019-12-01 MED ORDER — FLUORESCEIN SODIUM 1 MG OP STRP
ORAL_STRIP | OPHTHALMIC | Status: AC
  Administered 2019-12-01: 1 via OPHTHALMIC
  Filled 2019-12-01: qty 1

## 2019-12-01 MED ORDER — FLUORESCEIN SODIUM 1 MG OP STRP
1.0000 | ORAL_STRIP | Freq: Once | OPHTHALMIC | Status: AC
  Administered 2019-12-01: 1 via OPHTHALMIC

## 2019-12-01 MED ORDER — TETRACAINE HCL 0.5 % OP SOLN
2.0000 [drp] | Freq: Once | OPHTHALMIC | Status: AC
  Administered 2019-12-01: 2 [drp] via OPHTHALMIC
  Filled 2019-12-01: qty 4

## 2019-12-01 NOTE — ED Notes (Signed)
Visual Acuity: LEFT EYE 20/40, RIGHT EYE 20/40.
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
## 2020-03-23 ENCOUNTER — Emergency Department: Payer: Self-pay

## 2020-03-23 ENCOUNTER — Emergency Department
Admission: EM | Admit: 2020-03-23 | Discharge: 2020-03-23 | Disposition: A | Discharge status: Home / Self Care | Payer: Self-pay | Attending: Student in an Organized Health Care Education/Training Program | Admitting: Student in an Organized Health Care Education/Training Program

## 2020-03-23 ENCOUNTER — Other Ambulatory Visit: Payer: Self-pay

## 2020-03-23 DIAGNOSIS — S60222A Contusion of left hand, initial encounter: Secondary | ICD-10-CM | POA: Insufficient documentation

## 2020-03-23 DIAGNOSIS — J45909 Unspecified asthma, uncomplicated: Secondary | ICD-10-CM | POA: Insufficient documentation

## 2020-03-23 DIAGNOSIS — T07XXXA Unspecified multiple injuries, initial encounter: Secondary | ICD-10-CM

## 2020-03-23 DIAGNOSIS — W228XXA Striking against or struck by other objects, initial encounter: Secondary | ICD-10-CM | POA: Insufficient documentation

## 2020-03-23 DIAGNOSIS — F17210 Nicotine dependence, cigarettes, uncomplicated: Secondary | ICD-10-CM | POA: Insufficient documentation

## 2020-03-23 DIAGNOSIS — Z23 Encounter for immunization: Secondary | ICD-10-CM | POA: Insufficient documentation

## 2020-03-23 MED ORDER — CLINDAMYCIN HCL 300 MG PO CAPS: 300.0000 mg | ORAL_CAPSULE | Freq: Three times a day (TID) | ORAL | 0 refills | Status: DC

## 2020-03-23 MED ORDER — TRAMADOL HCL 50 MG PO TABS: 50.0000 mg | ORAL_TABLET | Freq: Four times a day (QID) | ORAL | 0 refills | Status: DC | PRN

## 2020-03-23 MED ORDER — TETANUS-DIPHTH-ACELL PERTUSSIS 5-2.5-18.5 LF-MCG/0.5 IM SUSY
0.5000 mL | PREFILLED_SYRINGE | Freq: Once | INTRAMUSCULAR | Status: AC
  Administered 2020-03-23: 0.5 mL via INTRAMUSCULAR
  Filled 2020-03-23: qty 0.5
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
## 2020-03-23 NOTE — Discharge Instructions (Addendum)
Follow-up with your primary care provider or an urgent care if any continued problems with your hand or if you see any signs of infection.  A prescription for clindamycin which is an antibiotic was sent to your pharmacy to help prevent infection.  Clean your left hand with mild soap and water and dry completely before wrapping it again.  Tramadol was sent to the pharmacy as well to take as needed for pain.  You may take ibuprofen or Tylenol with this medication if additional pain medication is needed.

## 2020-11-04 ENCOUNTER — Emergency Department: Payer: Self-pay

## 2020-11-04 ENCOUNTER — Other Ambulatory Visit: Payer: Self-pay

## 2020-11-04 ENCOUNTER — Emergency Department
Admission: EM | Admit: 2020-11-04 | Discharge: 2020-11-04 | Disposition: A | Discharge status: Home / Self Care | Payer: Self-pay | Attending: Emergency Medicine | Admitting: Emergency Medicine

## 2020-11-04 ENCOUNTER — Encounter: Payer: Self-pay | Admitting: Emergency Medicine

## 2020-11-04 DIAGNOSIS — F17210 Nicotine dependence, cigarettes, uncomplicated: Secondary | ICD-10-CM | POA: Insufficient documentation

## 2020-11-04 DIAGNOSIS — M25562 Pain in left knee: Secondary | ICD-10-CM | POA: Insufficient documentation

## 2020-11-04 DIAGNOSIS — J45909 Unspecified asthma, uncomplicated: Secondary | ICD-10-CM | POA: Insufficient documentation

## 2020-11-04 MED ORDER — NAPROXEN 500 MG PO TABS: 500.0000 mg | ORAL_TABLET | Freq: Two times a day (BID) | ORAL | 2 refills | Status: AC

## 2020-11-04 MED ORDER — LIDOCAINE 5 % EX PTCH
1.0000 | MEDICATED_PATCH | CUTANEOUS | Status: DC
  Administered 2020-11-04: 1 via TRANSDERMAL
  Filled 2020-11-04: qty 1

## 2020-11-04 NOTE — Discharge Instructions (Addendum)
Buy a knee brace that has a cushion that will push your knee cap medially towards your inner thigh

## 2020-11-04 NOTE — ED Provider Notes (Signed)
[facility] Emergency Department Provider Note  ____________________________________________   Event Date/Time   First MD Initiated Contact with Patient 11/04/20 1000     (approximate)  I have reviewed the triage vital signs and the nursing notes.   HISTORY  Chief Complaint Knee Pain    HPI Tina Ibarra is a 30 y.o. female presents emergency department complaining of left knee pain since yesterday.  States he does it every time is get ready to rain.  No known injury.  Does have pain with bending and extending the knee.  No fever or chills.  Past Medical History:  Diagnosis Date   Asthma     There are no problems to display for this patient.   History reviewed. No pertinent surgical history.  Prior to Admission medications   Medication Sig Start Date End Date Taking? Authorizing Provider  naproxen (NAPROSYN) 500 MG tablet Take 1 tablet (500 mg total) by mouth 2 (two) times daily with a meal. 11/04/20 11/04/21 Yes Elicia Lui, Roselyn Bering, PA-C    Allergies Mushroom extract complex and Amoxicillin  No family history on file.  Social History Social History   Tobacco Use   Smoking status: Every Day    Types: Cigarettes   Smokeless tobacco: Never  Substance Use Topics   Alcohol use: Yes   Drug use: Yes    Review of Systems  Constitutional: No fever/chills Eyes: No visual changes. ENT: No sore throat. Respiratory: Denies cough Cardiovascular: Denies chest pain Gastrointestinal: Denies abdominal pain Genitourinary: Negative for dysuria. Musculoskeletal: Negative for back pain.  Positive for left knee pain Skin: Negative for rash. Psychiatric: no mood changes,     ____________________________________________   PHYSICAL EXAM:  VITAL SIGNS: ED Triage Vitals  Enc Vitals Group     BP 11/04/20 0916 (!) 121/45     Pulse Rate 11/04/20 0916 (!) 54     Resp 11/04/20 0916 20     Temp 11/04/20 0916 98.3 F (36.8 C)     Temp Source  11/04/20 0916 Oral     SpO2 11/04/20 0916 99 %     Weight 11/04/20 0917 236 lb (107 kg)     Height 11/04/20 0917 5\' 4"  (1.626 m)     Head Circumference --      Peak Flow --      Pain Score 11/04/20 0917 8     Pain Loc --      Pain Edu? --      Excl. in GC? --     Constitutional: Alert and oriented. Well appearing and in no acute distress. Eyes: Conjunctivae are normal.  Head: Atraumatic. Nose: No congestion/rhinnorhea. Mouth/Throat: Mucous membranes are moist.   Neck:  supple no lymphadenopathy noted Cardiovascular: Normal rate, regular rhythm.  Respiratory: Normal respiratory effort.  No retractions,  GU: deferred Musculoskeletal: FROM all extremities, warm and well perfused, crepitus noted with flexion of the left knee, area is tender at the joint line, small amount of swelling noted at the joint line, neurovascular intact Neurologic:  Normal speech and language.  Skin:  Skin is warm, dry and intact. No rash noted. Psychiatric: Mood and affect are normal. Speech and behavior are normal.  ____________________________________________   LABS (all labs ordered are listed, but only abnormal results are displayed)  Labs Reviewed - No data to display ____________________________________________   ____________________________________________  RADIOLOGY  X-ray of the left leg  ____________________________________________   PROCEDURES  Procedure(s) performed: No  Procedures    ____________________________________________  INITIAL IMPRESSION / ASSESSMENT AND PLAN / ED COURSE  Pertinent labs & imaging results that were available during my care of the patient were reviewed by me and considered in my medical decision making (see chart for details).   Patient is a 30 year old female presents for left knee pain.  See HPI.  Physical exam shows patient be stable.  X-ray of the left knee  X-ray of the left knee reviewed by me confirmed by radiology to be negative for  any acute abnormality  Patient was given instructions to buy a patella stabilizer.  Feel that she needs  Tina Ibarra was evaluated in Emergency Department on 11/04/2020 for the symptoms described in the history of present illness. She was evaluated in the context of the global COVID-19 pandemic, which necessitated consideration that the patient might be at risk for infection with the SARS-CoV-2 virus that causes COVID-19. Institutional protocols and algorithms that pertain to the evaluation of patients at risk for COVID-19 are in a state of rapid change based on information released by regulatory bodies including the CDC and federal and state organizations. These policies and algorithms were followed during the patient's care in the ED.    As part of my medical decision making, I reviewed the following data within the electronic MEDICAL RECORD NUMBERApply pressure to move the patella medially.  Follow-up with her regular doctor or orthopedics.  Return emergency department.  Apply ice.  Take Naprosyn.  She is discharged stable condition with a work note.  ____________________________________________   FINAL CLINICAL IMPRESSION(S) / ED DIAGNOSES  Final diagnoses:  Acute pain of left knee      NEW MEDICATIONS STARTED DURING THIS VISIT:  New Prescriptions   NAPROXEN (NAPROSYN) 500 MG TABLET    Take 1 tablet (500 mg total) by mouth 2 (two) times daily with a meal.     Note:  This document was prepared using Dragon voice recognition software and may include unintentional dictation errors.    Faythe Ghee, PA-C 11/04/20 1117    Jene Every, MD 11/04/20 [phone]
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
## 2020-12-25 ENCOUNTER — Emergency Department
Admission: EM | Admit: 2020-12-25 | Discharge: 2020-12-25 | Disposition: A | Discharge status: Home / Self Care | Payer: Medicaid Other | Attending: Emergency Medicine | Admitting: Emergency Medicine

## 2020-12-25 ENCOUNTER — Emergency Department: Payer: Medicaid Other

## 2020-12-25 ENCOUNTER — Other Ambulatory Visit: Payer: Self-pay

## 2020-12-25 DIAGNOSIS — F17210 Nicotine dependence, cigarettes, uncomplicated: Secondary | ICD-10-CM | POA: Insufficient documentation

## 2020-12-25 DIAGNOSIS — J209 Acute bronchitis, unspecified: Secondary | ICD-10-CM | POA: Insufficient documentation

## 2020-12-25 DIAGNOSIS — Z20822 Contact with and (suspected) exposure to covid-19: Secondary | ICD-10-CM | POA: Insufficient documentation

## 2020-12-25 DIAGNOSIS — J45909 Unspecified asthma, uncomplicated: Secondary | ICD-10-CM | POA: Insufficient documentation

## 2020-12-25 LAB — RESP PANEL BY RT-PCR (FLU A&B, COVID) ARPGX2
Influenza A by PCR: NEGATIVE
Influenza B by PCR: NEGATIVE
SARS Coronavirus 2 by RT PCR: NEGATIVE

## 2020-12-25 MED ORDER — AZITHROMYCIN 250 MG PO TABS: ORAL_TABLET | ORAL | 0 refills | Status: DC

## 2020-12-25 MED ORDER — PREDNISONE 10 MG (21) PO TBPK: ORAL_TABLET | ORAL | 0 refills | Status: DC

## 2020-12-25 MED ORDER — BENZONATATE 100 MG PO CAPS: 200.0000 mg | ORAL_CAPSULE | Freq: Three times a day (TID) | ORAL | 0 refills | Status: AC | PRN

## 2020-12-25 NOTE — Discharge Instructions (Signed)
Follow-up with your regular doctor if not improved in 3 days.  Return emergency department worsening.  Use medication as prescribed. 

## 2020-12-25 NOTE — ED Provider Notes (Signed)
[facility] Emergency Department Provider Note  ____________________________________________   Event Date/Time   First MD Initiated Contact with Patient 12/25/20 [phone]     (approximate)  I have reviewed the triage vital signs and the nursing notes.   HISTORY  Chief Complaint URI    HPI Tina Ibarra is a 30 y.o. female presents to the emergency department with URI symptoms for 3 weeks.   Is complaining of cough, congestion, denies fever, chills, chest pain, shortness of breath close contact with Covid19+, patient does have history of asthma.   Past Medical History:  Diagnosis Date   Asthma     There are no problems to display for this patient.   History reviewed. No pertinent surgical history.  Prior to Admission medications   Medication Sig Start Date End Date Taking? Authorizing Provider  azithromycin (ZITHROMAX Z-PAK) 250 MG tablet 2 pills today then 1 pill a day for 4 days 12/25/20  Yes Jed Kutch, Roselyn Bering, PA-C  benzonatate (TESSALON PERLES) 100 MG capsule Take 2 capsules (200 mg total) by mouth 3 (three) times daily as needed for cough. 12/25/20 12/25/21 Yes Harpreet Pompey, Roselyn Bering, PA-C  predniSONE (STERAPRED UNI-PAK 21 TAB) 10 MG (21) TBPK tablet Take 6 pills on day one then decrease by 1 pill each day 12/25/20  Yes Sherrie Mustache Roselyn Bering, PA-C  naproxen (NAPROSYN) 500 MG tablet Take 1 tablet (500 mg total) by mouth 2 (two) times daily with a meal. 11/04/20 11/04/21  Saquoia Sianez, Roselyn Bering, PA-C    Allergies Mushroom extract complex and Amoxicillin  No family history on file.  Social History Social History   Tobacco Use   Smoking status: Every Day    Types: Cigarettes   Smokeless tobacco: Never  Substance Use Topics   Alcohol use: Yes   Drug use: Yes    Review of Systems  Constitutional: Denies fever/chills Eyes: No visual changes. ENT: Denies sore throat. Respiratory: Positive cough Cardiovascular: Denies chest pain Gastrointestinal: Denies  abdominal pain Genitourinary: Negative for dysuria. Musculoskeletal: Negative for back pain. Skin: Negative for rash. Neurological: Denies neurological changes    ____________________________________________   PHYSICAL EXAM:  VITAL SIGNS: ED Triage Vitals  Enc Vitals Group     BP 12/25/20 0922 (!) 132/100     Pulse Rate 12/25/20 0922 (!) 116     Resp 12/25/20 0922 17     Temp 12/25/20 0922 98.6 F (37 C)     Temp Source 12/25/20 0922 Oral     SpO2 12/25/20 0922 97 %     Weight 12/25/20 0920 227 lb (103 kg)     Height 12/25/20 0920 5\' 4"  (1.626 m)     Head Circumference --      Peak Flow --      Pain Score 12/25/20 0920 0     Pain Loc --      Pain Edu? --      Excl. in GC? --     Constitutional: Alert and oriented. Well appearing and in no acute distress. Eyes: Conjunctivae are normal.  Head: Atraumatic. Nose: No congestion/rhinnorhea. Mouth/Throat: Mucous membranes are moist.   Neck:  supple no lymphadenopathy noted Cardiovascular: Normal rate, regular rhythm. Heart sounds are normal Respiratory: Normal respiratory effort.  No retractions, lungs CTA, cough is dry and hacking GU: deferred Musculoskeletal: FROM all extremities, warm and well perfused Neurologic:  Normal speech and language.  Skin:  Skin is warm, dry and intact. No rash noted. Psychiatric: Mood and affect are normal.  Speech and behavior are normal.  ____________________________________________   LABS (all labs ordered are listed, but only abnormal results are displayed)  Labs Reviewed  RESP PANEL BY RT-PCR (FLU A&B, COVID) [id number]   ____________________________________________   ____________________________________________  RADIOLOGY  Chest x-ray  ____________________________________________   PROCEDURES  Procedure(s) performed: No  Procedures    ____________________________________________   INITIAL IMPRESSION / ASSESSMENT AND PLAN / ED COURSE  Pertinent labs & imaging  results that were available during my care of the patient were reviewed by me and considered in my medical decision making (see chart for details).   Patient is a 30 year old female who complains of URI symptoms.  Exam is consistent with bronchitis  Pending test for covid\flu  Chest x-ray reviewed by me confirmed by radiology to be negative  I did discuss findings with patient.  Even if she does have COVID she has been sick for 3 weeks and tends to be worsening.  We will cover her with a Z-Pak, steroid pack, Tessalon Perles for cough.  She is given 2 days off work.  We will call her back with respiratory panel results.  She is to quarantine herself at home.  Follow-up with her regular doctor if not improving in 3 days.  Return emergency department worsening.  She was discharged stable condition.  Respiratory panel is negative.  I did try to call the patient but her voicemail was full.  The patient was instructed to quarantine themselves at home.  Follow-up with your regular doctor if any concerns.  Return emergency department for worsening. OTC measures discussed     Tina Ibarra was evaluated in Emergency Department on 12/25/2020 for the symptoms described in the history of present illness. She was evaluated in the context of the global COVID-19 pandemic, which necessitated consideration that the patient might be at risk for infection with the SARS-CoV-2 virus that causes COVID-19. Institutional protocols and algorithms that pertain to the evaluation of patients at risk for COVID-19 are in a state of rapid change based on information released by regulatory bodies including the CDC and federal and state organizations. These policies and algorithms were followed during the patient's care in the ED.   As part of my medical decision making, I reviewed the following data within the electronic MEDICAL RECORD NUMBER Nursing notes reviewed and incorporated, Labs reviewed , Old chart reviewed, Radiograph  reviewed , Notes from prior ED visits, and [place] Controlled Substance Database  ____________________________________________   FINAL CLINICAL IMPRESSION(S) / ED DIAGNOSES  Final diagnoses:  Acute bronchitis, unspecified organism      NEW MEDICATIONS STARTED DURING THIS VISIT:  New Prescriptions   AZITHROMYCIN (ZITHROMAX Z-PAK) 250 MG TABLET    2 pills today then 1 pill a day for 4 days   BENZONATATE (TESSALON PERLES) 100 MG CAPSULE    Take 2 capsules (200 mg total) by mouth 3 (three) times daily as needed for cough.   PREDNISONE (STERAPRED UNI-PAK 21 TAB) 10 MG (21) TBPK TABLET    Take 6 pills on day one then decrease by 1 pill each day     Note:  This document was prepared using Dragon voice recognition software and may include unintentional dictation errors.    Faythe Ghee, PA-C 12/25/20 1410    Delton Prairie, MD 12/25/20 1430

## 2020-12-25 NOTE — ED Triage Notes (Signed)
Pt c/o cough with congestion for the past 3 weeks. States she has been taking OTC with no relief.

## 2022-03-06 ENCOUNTER — Other Ambulatory Visit: Payer: Self-pay

## 2022-03-06 ENCOUNTER — Emergency Department
Admission: EM | Admit: 2022-03-06 | Discharge: 2022-03-06 | Disposition: A | Discharge status: Home / Self Care | Payer: Medicaid Other | Attending: Emergency Medicine | Admitting: Emergency Medicine

## 2022-03-06 DIAGNOSIS — J101 Influenza due to other identified influenza virus with other respiratory manifestations: Secondary | ICD-10-CM

## 2022-03-06 DIAGNOSIS — Z20822 Contact with and (suspected) exposure to covid-19: Secondary | ICD-10-CM | POA: Insufficient documentation

## 2022-03-06 LAB — RESP PANEL BY RT-PCR (RSV, FLU A&B, COVID)  RVPGX2
Influenza A by PCR: NEGATIVE
Influenza B by PCR: POSITIVE — AB
Resp Syncytial Virus by PCR: NEGATIVE
SARS Coronavirus 2 by RT PCR: NEGATIVE

## 2022-03-06 NOTE — Discharge Instructions (Addendum)
Follow-up with your primary care provider if any continued problems or concerns.  Tylenol or ibuprofen as needed for body aches, fever or headache.  Increase fluids to stay hydrated.  Take over-the-counter medications for cough and congestion.

## 2022-03-06 NOTE — ED Provider Notes (Signed)
[facility] Provider Note    Event Date/Time   First MD Initiated Contact with Patient 03/06/22 [phone]     (approximate)   History   flu symptoms    HPI  Tina Ibarra is a 32 y.o. female   presents to the ED today with complaint of bodyaches, chills, cough and congestion for the last 2 days.  There has been no known exposure to influenza.  Patient also endorses nausea and vomiting but denies diarrhea.      Physical Exam   Triage Vital Signs: ED Triage Vitals  Enc Vitals Group     BP 03/06/22 0654 (!) 133/94     Pulse Rate 03/06/22 0654 100     Resp 03/06/22 0654 20     Temp 03/06/22 0653 99 F (37.2 C)     Temp Source 03/06/22 0653 Oral     SpO2 03/06/22 0654 97 %     Weight 03/06/22 0653 235 lb (106.6 kg)     Height 03/06/22 0653 '5\' 4"'$  (1.626 m)     Head Circumference --      Peak Flow --      Pain Score 03/06/22 0653 5     Pain Loc --      Pain Edu? --      Excl. in [place]? --     Most recent vital signs: Vitals:   03/06/22 0653 03/06/22 0654  BP:  (!) 133/94  Pulse:  100  Resp:  20  Temp: 99 F (37.2 C)   SpO2:  97%     General: Awake, no distress.  CV:  Good peripheral perfusion.  Heart regular rate and rhythm. Resp:  Normal effort.  Clear bilaterally. Abd:  No distention.  Other:     ED Results / Procedures / Treatments   Labs (all labs ordered are listed, but only abnormal results are displayed) Labs Reviewed  RESP PANEL BY RT-PCR (RSV, FLU A&B, COVID)  [id number] - Abnormal; Notable for the following components:      Result Value   Influenza B by PCR POSITIVE (*)    All other components within normal limits      PROCEDURES:  Critical Care performed:   Procedures   MEDICATIONS ORDERED IN ED: Medications - No data to display   IMPRESSION / MDM / [place] / ED COURSE  I reviewed the triage vital signs and the nursing notes.   Differential diagnosis includes, but is not limited to, COVID,  influenza, RSV, viral upper respiratory infection.  32 year old female presents to the ED with complaint of cough, congestion, and bodyaches for the last 2 days.  Patient was exposed to someone that had the flu.  Physical exam was benign with exception of a low-grade temp of 99.  Patient tested positive for influenza B and made aware.  A note was written for her to remain out of work.  She is to increase fluids to stay hydrated, Tylenol or ibuprofen as needed for body aches, headache or fever and follow-up with her PCP or urgent care if any continued problems or concerns.      Patient's presentation is most consistent with acute complicated illness / injury requiring diagnostic workup.  FINAL CLINICAL IMPRESSION(S) / ED DIAGNOSES   Final diagnoses:  Influenza B     Rx / DC Orders   ED Discharge Orders     None        Note:  This document was prepared using  [employer]agon [employer] education [employer] and may include unintentional dictation errors.   Johnn Hai, PA-C 03/06/22 [id number]    Nathaniel Man, MD 03/06/22 1131

## 2022-03-06 NOTE — ED Triage Notes (Signed)
Chills, body aches, cough, congestion x 2 days. Known exposure to Flu. Pt ambulatory to triage. Breathing unlabored with symmetric chest rise and fall. Pt alert and oriented.

## 2022-09-09 ENCOUNTER — Encounter: Payer: Self-pay | Admitting: Emergency Medicine

## 2022-09-09 ENCOUNTER — Emergency Department
Admission: EM | Admit: 2022-09-09 | Discharge: 2022-09-09 | Disposition: A | Discharge status: Home / Self Care | Payer: Self-pay | Attending: Emergency Medicine | Admitting: Emergency Medicine

## 2022-09-09 ENCOUNTER — Other Ambulatory Visit: Payer: Self-pay

## 2022-09-09 DIAGNOSIS — J069 Acute upper respiratory infection, unspecified: Secondary | ICD-10-CM | POA: Insufficient documentation

## 2022-09-09 DIAGNOSIS — Z20822 Contact with and (suspected) exposure to covid-19: Secondary | ICD-10-CM | POA: Insufficient documentation

## 2022-09-09 DIAGNOSIS — J45909 Unspecified asthma, uncomplicated: Secondary | ICD-10-CM | POA: Insufficient documentation

## 2022-09-09 LAB — RESP PANEL BY RT-PCR (RSV, FLU A&B, COVID)  RVPGX2
Influenza A by PCR: NEGATIVE
Influenza B by PCR: NEGATIVE
Resp Syncytial Virus by PCR: NEGATIVE
SARS Coronavirus 2 by RT PCR: NEGATIVE

## 2022-09-09 MED ORDER — ONDANSETRON HCL 8 MG PO TABS: 8.0000 mg | ORAL_TABLET | Freq: Three times a day (TID) | ORAL | 0 refills | Status: AC | PRN

## 2022-09-09 NOTE — Discharge Instructions (Addendum)
Please treat your symptoms with over-the-counter cold medication.  I have also sent some nausea medication. This can be taken once every 8 hours as needed for nausea.

## 2022-09-09 NOTE — ED Triage Notes (Signed)
Patient to ED via POV for eye pain, cough, vomiting, body aches. Ongoing since yesterday. Unsure about fevers at home.

## 2022-09-09 NOTE — ED Provider Notes (Signed)
[facility] Provider Note    Event Date/Time   First MD Initiated Contact with Patient 09/09/22 [phone]     (approximate)   History   Chills   HPI  Tina Ibarra is a 32 y.o. female with PMH of asthma who presents for evaluation of URI symptoms and nausea/vomiting.  Patient has had cough, congestion and bodyaches.  She reports taking over-the-counter cold medication at home.     Physical Exam   Triage Vital Signs: ED Triage Vitals  Encounter Vitals Group     BP 09/09/22 0754 (!) 151/101     Systolic BP Percentile --      Diastolic BP Percentile --      Pulse Rate 09/09/22 0754 83     Resp 09/09/22 0754 18     Temp 09/09/22 0754 99.7 F (37.6 C)     Temp Source 09/09/22 0754 Oral     SpO2 09/09/22 0754 98 %     Weight 09/09/22 0748 248 lb (112.5 kg)     Height 09/09/22 0748 5\' 4"  (1.626 m)     Head Circumference --      Peak Flow --      Pain Score 09/09/22 0748 10     Pain Loc --      Pain Education --      Exclude from Growth Chart --     Most recent vital signs: Vitals:   09/09/22 0754  BP: (!) 151/101  Pulse: 83  Resp: 18  Temp: 99.7 F (37.6 C)  SpO2: 98%    General: Awake, no distress.  CV:  Good peripheral perfusion.  RRR. Resp:  Normal effort.  CTAB. Abd:  No distention.     ED Results / Procedures / Treatments   Labs (all labs ordered are listed, but only abnormal results are displayed) Labs Reviewed  RESP PANEL BY RT-PCR (RSV, FLU A&B, COVID)  RVPGX2    PROCEDURES:  Critical Care performed: No  Procedures   MEDICATIONS ORDERED IN ED: Medications - No data to display   IMPRESSION / MDM / ASSESSMENT AND PLAN / ED COURSE  I reviewed the triage vital signs and the nursing notes.                             32 year old female presents for evaluation of URI symptoms.  Patient was hypertensive in triage otherwise VSS.  Patient NAD and nontoxic-appearing on exam.  Differential diagnosis includes, but is  not limited to, COVID, flu, RSV, other viral URI.  Patient's presentation is most consistent with acute, uncomplicated illness.  Respiratory panel was negative for flu, COVID and strep.  I advised patient on symptomatic management using over-the-counter cold medication.  I will also send in some medication for her nausea and vomiting.  Patient was given a work note.  Patient voiced understanding, all questions were answered and she was stable at discharge.       FINAL CLINICAL IMPRESSION(S) / ED DIAGNOSES   Final diagnoses:  Viral upper respiratory tract infection     Rx / DC Orders   ED Discharge Orders          Ordered    ondansetron (ZOFRAN) 8 MG tablet  Every 8 hours PRN        09/09/22 0949             Note:  This document was prepared using Dragon voice recognition software  and may include unintentional dictation errors.   Cameron Ali, PA-C 09/09/22 0950    Trinna Post, MD 09/10/22 [phone]

## 2022-09-12 ENCOUNTER — Emergency Department
Admission: EM | Admit: 2022-09-12 | Discharge: 2022-09-12 | Disposition: A | Discharge status: Home / Self Care | Payer: Self-pay | Attending: Emergency Medicine | Admitting: Emergency Medicine

## 2022-09-12 ENCOUNTER — Encounter: Payer: Self-pay | Admitting: Emergency Medicine

## 2022-09-12 ENCOUNTER — Other Ambulatory Visit: Payer: Self-pay

## 2022-09-12 DIAGNOSIS — S40861A Insect bite (nonvenomous) of right upper arm, initial encounter: Secondary | ICD-10-CM | POA: Insufficient documentation

## 2022-09-12 DIAGNOSIS — W57XXXA Bitten or stung by nonvenomous insect and other nonvenomous arthropods, initial encounter: Secondary | ICD-10-CM | POA: Insufficient documentation

## 2022-09-12 DIAGNOSIS — L03113 Cellulitis of right upper limb: Secondary | ICD-10-CM | POA: Insufficient documentation

## 2022-09-12 DIAGNOSIS — S80861A Insect bite (nonvenomous), right lower leg, initial encounter: Secondary | ICD-10-CM | POA: Insufficient documentation

## 2022-09-12 DIAGNOSIS — L03115 Cellulitis of right lower limb: Secondary | ICD-10-CM | POA: Insufficient documentation

## 2022-09-12 MED ORDER — CEPHALEXIN 500 MG PO CAPS: 500.0000 mg | ORAL_CAPSULE | Freq: Four times a day (QID) | ORAL | 0 refills | Status: AC

## 2022-09-12 NOTE — Discharge Instructions (Addendum)
Keep your extremities elevated is much as possible and apply cool compresses.  Take the antibiotics as prescribed.  Please return for any new, worsening, or change in symptoms or other concerns.  It was a pleasure caring for you today.

## 2022-09-12 NOTE — ED Provider Notes (Signed)
[facility] Provider Note    Event Date/Time   First MD Initiated Contact with Patient 09/12/22 1307     (approximate)   History   Insect Bite   HPI  Tina Ibarra is a 32 y.o. female who presents today for evaluation of insect bites.  Patient reports that she was outside waiting for a bus when she was bit by something on her ankle and also her right arm.  She is unsure what bit her.  She reports it was immediately itchy but now it is slightly painful and more red.  She still able to ambulate.  No fevers or chills.  There are no problems to display for this patient.         Physical Exam   Triage Vital Signs: ED Triage Vitals  Encounter Vitals Group     BP 09/12/22 1235 (!) 131/105     Systolic BP Percentile --      Diastolic BP Percentile --      Pulse Rate 09/12/22 1233 (!) 112     Resp 09/12/22 1233 16     Temp 09/12/22 1244 98.5 F (36.9 C)     Temp Source 09/12/22 1244 Oral     SpO2 09/12/22 1233 96 %     Weight --      Height --      Head Circumference --      Peak Flow --      Pain Score 09/12/22 1233 10     Pain Loc --      Pain Education --      Exclude from Growth Chart --     Most recent vital signs: Vitals:   09/12/22 1244 09/12/22 1319  BP:  127/81  Pulse:  91  Resp:  18  Temp: 98.5 F (36.9 C) 98.4 F (36.9 C)  SpO2:      Physical Exam Vitals and nursing note reviewed.  Constitutional:      General: Awake and alert. No acute distress.    Appearance: Normal appearance. The patient is obese.  HENT:     Head: Normocephalic and atraumatic.     Mouth: Mucous membranes are moist.  Eyes:     General: PERRL. Normal EOMs        Right eye: No discharge.        Left eye: No discharge.     Conjunctiva/sclera: Conjunctivae normal.  Cardiovascular:     Rate and Rhythm: Normal rate and regular rhythm.     Pulses: Normal pulses.  Pulmonary:     Effort: Pulmonary effort is normal. No respiratory distress.      Breath sounds: Normal breath sounds.  Abdominal:     Abdomen is soft. There is no abdominal tenderness. No rebound or guarding. No distention. Musculoskeletal:        General: No swelling. Normal range of motion.     Cervical back: Normal range of motion and neck supple.  Right ankle: 2 obvious areas of raised red papules presumably the location of the insect bite with mild surrounding erythema.  Normal pedal pulses.  No lymphangitis.  Compartment soft and compressible throughout.  No open wounds.  No drainage. Right upper arm with an additional insect bites with surrounding erythema extending approximately 5 cm.  No lymphangitis.  Compartment soft and compressible throughout.  Normal radial pulse. Skin:    General: Skin is warm and dry.     Capillary Refill: Capillary refill takes less than 2  seconds.     Findings: No rash.  Neurological:     Mental Status: The patient is awake and alert.      ED Results / Procedures / Treatments   Labs (all labs ordered are listed, but only abnormal results are displayed) Labs Reviewed - No data to display   EKG     RADIOLOGY     PROCEDURES:  Critical Care performed:   Procedures   MEDICATIONS ORDERED IN ED: Medications - No data to display   IMPRESSION / MDM / ASSESSMENT AND PLAN / ED COURSE  I reviewed the triage vital signs and the nursing notes.   Differential diagnosis includes, but is not limited to, insect bite, cellulitis, less likely abscess.  Physical exam is consistent with developing cellulitis. No crepitus or bullae or hemodynamic instability or pain out of proportion to indicate deep space infection. No fluctuance to suggest cystic lesion such as abscess. No fevers or constitutional symptoms to suggest systemic infection.  Physical exam does not show any evidence of joint involvement. No lymphangitis. Patient will initiate oral antibiotics and follow-up closely as an outpatient. Return if worsening or developing new  symptoms in which case may require IV antibiotics. Discussed care plan, return precautions, and advised close outpatient follow-up. Patient agrees with plan of care.    Patient's presentation is most consistent with acute complicated illness / injury requiring diagnostic workup.    FINAL CLINICAL IMPRESSION(S) / ED DIAGNOSES   Final diagnoses:  Insect bite of right upper arm, initial encounter  Insect bite of right lower leg, initial encounter  Cellulitis of right upper extremity  Cellulitis of right lower extremity     Rx / DC Orders   ED Discharge Orders          Ordered    cephALEXin (KEFLEX) 500 MG capsule  4 times daily        09/12/22 1314             Note:  This document was prepared using Dragon voice recognition software and may include unintentional dictation errors.   Jackelyn Hoehn, PA-C 09/12/22 1428    Janith Lima, MD 09/12/22 1524

## 2022-09-12 NOTE — ED Triage Notes (Signed)
Pt via POV from home. Pt c/o insect bites to the R ankle and R upper are that she noticed on Friday. States that the OTC medication has not been working. Pt is A&Ox4 and NAD

## 2022-09-13 NOTE — Group Note (Deleted)

## 2023-02-16 ENCOUNTER — Emergency Department
Admission: EM | Admit: 2023-02-16 | Discharge: 2023-02-16 | Disposition: A | Discharge status: Home / Self Care | Payer: Self-pay | Attending: Student in an Organized Health Care Education/Training Program | Admitting: Student in an Organized Health Care Education/Training Program

## 2023-02-16 ENCOUNTER — Other Ambulatory Visit: Payer: Self-pay

## 2023-02-16 ENCOUNTER — Encounter: Payer: Self-pay | Admitting: Emergency Medicine

## 2023-02-16 DIAGNOSIS — J101 Influenza due to other identified influenza virus with other respiratory manifestations: Secondary | ICD-10-CM | POA: Insufficient documentation

## 2023-02-16 DIAGNOSIS — F17200 Nicotine dependence, unspecified, uncomplicated: Secondary | ICD-10-CM | POA: Insufficient documentation

## 2023-02-16 DIAGNOSIS — U071 COVID-19: Secondary | ICD-10-CM | POA: Insufficient documentation

## 2023-02-16 DIAGNOSIS — J45909 Unspecified asthma, uncomplicated: Secondary | ICD-10-CM | POA: Insufficient documentation

## 2023-02-16 LAB — RESP PANEL BY RT-PCR (RSV, FLU A&B, COVID)  RVPGX2
Influenza A by PCR: POSITIVE — AB
Influenza B by PCR: NEGATIVE
Resp Syncytial Virus by PCR: NEGATIVE
SARS Coronavirus 2 by RT PCR: POSITIVE — AB

## 2023-02-16 MED ORDER — PROMETHAZINE-DM 6.25-15 MG/5ML PO SYRP: 5.0000 mL | ORAL_SOLUTION | Freq: Four times a day (QID) | ORAL | 0 refills | Status: AC | PRN

## 2023-02-16 MED ORDER — ACETAMINOPHEN 325 MG PO TABS
650.0000 mg | ORAL_TABLET | Freq: Once | ORAL | Status: AC
  Administered 2023-02-16: 650 mg via ORAL
  Filled 2023-02-16: qty 2

## 2023-02-16 MED ORDER — PROMETHAZINE-DM 6.25-15 MG/5ML PO SYRP: 5.0000 mL | ORAL_SOLUTION | Freq: Four times a day (QID) | ORAL | 0 refills | Status: DC | PRN

## 2023-02-16 NOTE — ED Provider Notes (Signed)
[facility] Provider Note    Event Date/Time   First MD Initiated Contact with Patient 02/16/23 [phone]     (approximate)   History   Cough   HPI  Tina Ibarra is a 33 y.o. female   presents to the ED with complaint of cough, congestion for the last 2 days.  Patient has been taking over-the-counter medication for fever.  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  She reports a history of asthma in childhood and continues to smoke.      Physical Exam   Triage Vital Signs: ED Triage Vitals  Encounter Vitals Group     BP 02/16/23 0818 (!) 159/95     Systolic BP Percentile --      Diastolic BP Percentile --      Pulse Rate 02/16/23 0818 (!) 116     Resp 02/16/23 0818 20     Temp 02/16/23 0818 (!) 100.6 F (38.1 C)     Temp Source 02/16/23 0818 Oral     SpO2 02/16/23 0818 98 %     Weight 02/16/23 0816 250 lb (113.4 kg)     Height 02/16/23 0816 5\' 4"  (1.626 m)     Head Circumference --      Peak Flow --      Pain Score 02/16/23 0816 0     Pain Loc --      Pain Education --      Exclude from Growth Chart --     Most recent vital signs: Vitals:   02/16/23 0818  BP: (!) 159/95  Pulse: (!) 116  Resp: 20  Temp: (!) 100.6 F (38.1 C)  SpO2: 98%     General: Awake, no distress.  Alert, talkative, nontoxic in appearance. CV:  Good peripheral perfusion.  Heart regular rate and rhythm. Resp:  Normal effort.  Lungs are clear bilaterally. Abd:  No distention.  Other:     ED Results / Procedures / Treatments   Labs (all labs ordered are listed, but only abnormal results are displayed) Labs Reviewed  RESP PANEL BY RT-PCR (RSV, FLU A&B, COVID)  RVPGX2 - Abnormal; Notable for the following components:      Result Value   SARS Coronavirus 2 by RT PCR POSITIVE (*)    Influenza A by PCR POSITIVE (*)    All other components within normal limits     PROCEDURES:  Critical Care performed:    Procedures   MEDICATIONS ORDERED IN ED: Medications  acetaminophen (TYLENOL) tablet 650 mg (650 mg Oral Given 02/16/23 0820)     IMPRESSION / MDM / ASSESSMENT AND PLAN / ED COURSE  I reviewed the triage vital signs and the nursing notes.   Differential diagnosis includes, but is not limited to, COVID, influenza, RSV, bronchitis, pneumonia, viral URI with cough.  33 year old female presents to the ED with complaint of cough, congestion, fever and overall not feeling well.  Patient was made aware that her respiratory panel came back positive for COVID and influenza.  She is strongly encouraged not to continue smoking especially while she is sick.  Increase fluids to stay hydrated and Tylenol or ibuprofen as needed for fever or bodyaches.  We discussed taking deep breaths frequently and being active within her house taking deep breaths to prevent pneumonia.  She is to follow-up with her PCP or return to the emergency department if any shortness of breath or difficulty breathing.  A  prescription for promethazine with dextromethorphan was sent to the pharmacy as needed for coughing.  Also a note for work was written.      Patient's presentation is most consistent with acute illness / injury with system symptoms.  FINAL CLINICAL IMPRESSION(S) / ED DIAGNOSES   Final diagnoses:  COVID  Influenza A     Rx / DC Orders   ED Discharge Orders          Ordered    promethazine-dextromethorphan (PROMETHAZINE-DM) 6.25-15 MG/5ML syrup  4 times daily PRN,   Status:  Discontinued        02/16/23 0926    promethazine-dextromethorphan (PROMETHAZINE-DM) 6.25-15 MG/5ML syrup  4 times daily PRN        02/16/23 3086             Note:  This document was prepared using Dragon voice recognition software and may include unintentional dictation errors.   Tommi Rumps, PA-C 02/16/23 5784    Willy Eddy, MD 02/16/23 [phone]

## 2023-02-16 NOTE — ED Notes (Signed)
On assessment, patient c/o cough, SOB, fevers, and generalized weakness x 2 days. Patient reports getting progressively worse. Patient is speaking in full sentences to this nurse but appears fatigued. A&O x4

## 2023-02-16 NOTE — Discharge Instructions (Addendum)
Follow-up with your primary care provider or urgent care if any continued problems.  Return to the emergency department if any severe worsening of your symptoms such as difficulty breathing or shortness of breath.  Do not smoke while you are sick.  Increase fluids to stay hydrated.  Tylenol or ibuprofen as needed for body aches, headache and fever.  You are contagious at this time.  A note for work was written.
